# Patient Record
Sex: Male | Born: 1980 | Race: Black or African American | Hispanic: No | Marital: Married | Smoking: Never smoker
Health system: Southern US, Community
[De-identification: ages and names within clinical notes are randomized; demographics above are authoritative.]

## PROBLEM LIST (undated history)

## (undated) DIAGNOSIS — J302 Other seasonal allergic rhinitis: Secondary | ICD-10-CM

## (undated) DIAGNOSIS — Z9109 Other allergy status, other than to drugs and biological substances: Secondary | ICD-10-CM

## (undated) DIAGNOSIS — T7840XA Allergy, unspecified, initial encounter: Secondary | ICD-10-CM

## (undated) HISTORY — DX: Allergy, unspecified, initial encounter: T78.40XA

## (undated) HISTORY — PX: OTHER SURGICAL HISTORY: SHX169

## (undated) HISTORY — DX: Other allergy status, other than to drugs and biological substances: Z91.09

## (undated) HISTORY — DX: Other seasonal allergic rhinitis: J30.2

---

## 2005-03-23 ENCOUNTER — Emergency Department (HOSPITAL_COMMUNITY): Admission: EM | Admit: 2005-03-23 | Discharge: 2005-03-23 | Payer: Self-pay | Admitting: Family Medicine

## 2013-10-16 ENCOUNTER — Encounter: Payer: Self-pay | Admitting: Physician Assistant

## 2013-10-16 ENCOUNTER — Ambulatory Visit (INDEPENDENT_AMBULATORY_CARE_PROVIDER_SITE_OTHER): Payer: 59 | Admitting: Physician Assistant

## 2013-10-16 VITALS — BP 118/78 | HR 75 | Temp 97.9°F | Resp 16 | Ht 66.0 in | Wt 221.5 lb

## 2013-10-16 DIAGNOSIS — K219 Gastro-esophageal reflux disease without esophagitis: Secondary | ICD-10-CM

## 2013-10-16 DIAGNOSIS — Z23 Encounter for immunization: Secondary | ICD-10-CM

## 2013-10-16 DIAGNOSIS — Z0000 Encounter for general adult medical examination without abnormal findings: Secondary | ICD-10-CM

## 2013-10-16 LAB — BASIC METABOLIC PANEL
BUN: 8 mg/dL (ref 6–23)
CO2: 26 mEq/L (ref 19–32)
Calcium: 9.7 mg/dL (ref 8.4–10.5)
Chloride: 101 mEq/L (ref 96–112)
Creat: 0.82 mg/dL (ref 0.50–1.35)
Glucose, Bld: 81 mg/dL (ref 70–99)
POTASSIUM: 4.3 meq/L (ref 3.5–5.3)
Sodium: 137 mEq/L (ref 135–145)

## 2013-10-16 LAB — CBC
HEMATOCRIT: 43.7 % (ref 39.0–52.0)
Hemoglobin: 14.9 g/dL (ref 13.0–17.0)
MCH: 29.4 pg (ref 26.0–34.0)
MCHC: 34.1 g/dL (ref 30.0–36.0)
MCV: 86.2 fL (ref 78.0–100.0)
Platelets: 282 10*3/uL (ref 150–400)
RBC: 5.07 MIL/uL (ref 4.22–5.81)
RDW: 13.5 % (ref 11.5–15.5)
WBC: 5 10*3/uL (ref 4.0–10.5)

## 2013-10-16 LAB — LIPID PANEL
Cholesterol: 238 mg/dL — ABNORMAL HIGH (ref 0–200)
HDL: 36 mg/dL — ABNORMAL LOW (ref >39)
LDL Cholesterol: 179 mg/dL — ABNORMAL HIGH (ref 0–99)
TRIGLYCERIDES: 117 mg/dL (ref <150)
Total CHOL/HDL Ratio: 6.6 Ratio
VLDL: 23 mg/dL (ref 0–40)

## 2013-10-16 LAB — HEPATIC FUNCTION PANEL
ALT: 51 U/L (ref 0–53)
AST: 25 U/L (ref 0–37)
Albumin: 4.4 g/dL (ref 3.5–5.2)
Alkaline Phosphatase: 69 U/L (ref 39–117)
BILIRUBIN DIRECT: 0.1 mg/dL (ref 0.0–0.3)
BILIRUBIN INDIRECT: 0.3 mg/dL (ref 0.2–1.2)
Total Bilirubin: 0.4 mg/dL (ref 0.2–1.2)
Total Protein: 7.9 g/dL (ref 6.0–8.3)

## 2013-10-16 LAB — HEMOGLOBIN A1C
Hgb A1c MFr Bld: 5.2 % (ref <5.7)
Mean Plasma Glucose: 103 mg/dL (ref <117)

## 2013-10-16 LAB — TSH: TSH: 1.695 u[IU]/mL (ref 0.350–4.500)

## 2013-10-16 NOTE — Progress Notes (Signed)
Pre visit review using our clinic review tool, if applicable. No additional management support is needed unless otherwise documented below in the visit note/SLS  

## 2013-10-16 NOTE — Progress Notes (Signed)
Patient presents to clinic today to establish care.  Patient also requesting CPE.  Is fasting for labs.  Acute Concerns: Patient c/o reflux and queasiness after eating a heavy or greasy meal.  Happens a couple times per week.  Denies alcohol consumption or NSAID use.  Denies abdominal pain, nausea, tenesmus, melena or hematochezia. Denies constipation or loose stools.  Denies chronic cough.   Denies globus.   Patient also recently seen by his Optometrist for yearly check up.  Was told he has slightly elevated IOP and they recommended he see a PCP to assess for diabetes.  Denies hx of elevated glucose.  Denies vision changes, neuropathy, polyuria or urinary frequency/urgency.  Health Maintenance: Dental -- UTD. Vision -- UTD.    Immunizations -- Due for Tetanus.  Past Medical History  Diagnosis Date  . Environmental allergies   . Seasonal allergies     Past Surgical History  Procedure Laterality Date  . Unremarkable.      No current outpatient prescriptions on file prior to visit.   No current facility-administered medications on file prior to visit.    No Known Allergies  Family History  Problem Relation Age of Onset  . Hypertension Father     Living  . Cervical cancer Mother     Living  . Hypertension Paternal Grandmother   . Hypertension Paternal Grandfather   . Stroke Paternal Grandfather   . Healthy Brother     x3  . Healthy Sister     x4  . Healthy Daughter     x1  . Diabetes Maternal Grandmother     History   Social History  . Marital Status: Married    Spouse Name: N/A    Number of Children: N/A  . Years of Education: N/A   Occupational History  . Not on file.   Social History Main Topics  . Smoking status: Never Smoker   . Smokeless tobacco: Never Used  . Alcohol Use: No  . Drug Use: No  . Sexual Activity: Yes   Other Topics Concern  . Not on file   Social History Narrative  . No narrative on file   Review of Systems  Constitutional:  Negative for fever and weight loss.  HENT: Negative for ear discharge, ear pain, hearing loss and tinnitus.   Eyes: Negative for blurred vision, double vision, photophobia and pain.  Respiratory: Negative for cough and shortness of breath.   Cardiovascular: Negative for chest pain and palpitations.  Gastrointestinal: Positive for vomiting. Negative for heartburn, nausea, abdominal pain, diarrhea, constipation, blood in stool and melena.  Genitourinary: Negative for dysuria, urgency, frequency, hematuria and flank pain.       Nocturia x 0.  Neurological: Negative for dizziness, loss of consciousness and headaches.  Endo/Heme/Allergies: Positive for environmental allergies.  Psychiatric/Behavioral: Negative for depression, suicidal ideas, hallucinations and substance abuse. The patient is not nervous/anxious and does not have insomnia.    BP 118/78  Pulse 75  Temp(Src) 97.9 F (36.6 C) (Oral)  Resp 16  Ht 5\' 6"  (1.676 m)  Wt 221 lb 8 oz (100.472 kg)  BMI 35.77 kg/m2  SpO2 98%  Physical Exam  Vitals reviewed. Constitutional: He is oriented to person, place, and time and well-developed, well-nourished, and in no distress.  HENT:  Head: Normocephalic and atraumatic.  Right Ear: External ear normal.  Left Ear: External ear normal.  Nose: Nose normal.  Mouth/Throat: Oropharynx is clear and moist.  Eyes: Conjunctivae are normal. Pupils are equal, round,  and reactive to light.  Neck: Neck supple. No thyromegaly present.  Cardiovascular: Normal rate, regular rhythm, normal heart sounds and intact distal pulses.   Pulmonary/Chest: Effort normal and breath sounds normal. No respiratory distress. He has no wheezes. He has no rales. He exhibits no tenderness.  Abdominal: Soft. Bowel sounds are normal. He exhibits no distension and no mass. There is no tenderness. There is no rebound and no guarding.  Lymphadenopathy:    He has no cervical adenopathy.  Neurological: He is alert and oriented to  person, place, and time.  Skin: Skin is warm and dry. No rash noted.  Psychiatric: Affect normal.   Assessment/Plan: GERD (gastroesophageal reflux disease) Discussed dietary and lifestyle changes including weight loss.  Avoid trigger foods.  Avoid late-night meals.  Take a daily probiotic.  Can take a Pepcid for acute symptoms.  Follow-up if symptoms are not improving.  Need for prophylactic vaccination with combined diphtheria-tetanus-pertussis (DTP) vaccine Immunization given by nursing.  Visit for preventive health examination Health maintenance up-to-date now that Tetanus has been given.  Will obtain fasting labs.

## 2013-10-16 NOTE — Patient Instructions (Signed)
Please obtain labs.  I will call you with your results.  For regurgitation symptoms -- try to avoid heavy or greasy meals.  Start a daily probiotic (I recommend Align).  Stay well hydrated.  Avoid late-night eating.  Please let me know if symptoms are not improving.  Preventive Care for Adults A healthy lifestyle and preventive care can promote health and wellness. Preventive health guidelines for men include the following key practices:  A routine yearly physical is a good way to check with your health care provider about your health and preventative screening. It is a chance to share any concerns and updates on your health and to receive a thorough exam.  Visit your dentist for a routine exam and preventative care every 6 months. Brush your teeth twice a day and floss once a day. Good oral hygiene prevents tooth decay and gum disease.  The frequency of eye exams is based on your age, health, family medical history, use of contact lenses, and other factors. Follow your health care provider's recommendations for frequency of eye exams.  Eat a healthy diet. Foods such as vegetables, fruits, whole grains, low-fat dairy products, and lean protein foods contain the nutrients you need without too many calories. Decrease your intake of foods high in solid fats, added sugars, and salt. Eat the right amount of calories for you.Get information about a proper diet from your health care provider, if necessary.  Regular physical exercise is one of the most important things you can do for your health. Most adults should get at least 150 minutes of moderate-intensity exercise (any activity that increases your heart rate and causes you to sweat) each week. In addition, most adults need muscle-strengthening exercises on 2 or more days a week.  Maintain a healthy weight. The body mass index (BMI) is a screening tool to identify possible weight problems. It provides an estimate of body fat based on height and weight.  Your health care provider can find your BMI and can help you achieve or maintain a healthy weight.For adults 20 years and older:  A BMI below 18.5 is considered underweight.  A BMI of 18.5 to 24.9 is normal.  A BMI of 25 to 29.9 is considered overweight.  A BMI of 30 and above is considered obese.  Maintain normal blood lipids and cholesterol levels by exercising and minimizing your intake of saturated fat. Eat a balanced diet with plenty of fruit and vegetables. Blood tests for lipids and cholesterol should begin at age 22 and be repeated every 5 years. If your lipid or cholesterol levels are high, you are over 50, or you are at high risk for heart disease, you may need your cholesterol levels checked more frequently.Ongoing high lipid and cholesterol levels should be treated with medicines if diet and exercise are not working.  If you smoke, find out from your health care provider how to quit. If you do not use tobacco, do not start.  Lung cancer screening is recommended for adults aged 76-80 years who are at high risk for developing lung cancer because of a history of smoking. A yearly low-dose CT scan of the lungs is recommended for people who have at least a 30-pack-year history of smoking and are a current smoker or have quit within the past 15 years. A pack year of smoking is smoking an average of 1 pack of cigarettes a day for 1 year (for example: 1 pack a day for 30 years or 2 packs a day for  15 years). Yearly screening should continue until the smoker has stopped smoking for at least 15 years. Yearly screening should be stopped for people who develop a health problem that would prevent them from having lung cancer treatment.  If you choose to drink alcohol, do not have more than 2 drinks per day. One drink is considered to be 12 ounces (355 mL) of beer, 5 ounces (148 mL) of wine, or 1.5 ounces (44 mL) of liquor.  Avoid use of street drugs. Do not share needles with anyone. Ask for help  if you need support or instructions about stopping the use of drugs.  High blood pressure causes heart disease and increases the risk of stroke. Your blood pressure should be checked at least every 1-2 years. Ongoing high blood pressure should be treated with medicines, if weight loss and exercise are not effective.  If you are 88-27 years old, ask your health care provider if you should take aspirin to prevent heart disease.  Diabetes screening involves taking a blood sample to check your fasting blood sugar level. This should be done once every 3 years, after age 2, if you are within normal weight and without risk factors for diabetes. Testing should be considered at a younger age or be carried out more frequently if you are overweight and have at least 1 risk factor for diabetes.  Colorectal cancer can be detected and often prevented. Most routine colorectal cancer screening begins at the age of 71 and continues through age 56. However, your health care provider may recommend screening at an earlier age if you have risk factors for colon cancer. On a yearly basis, your health care provider may provide home test kits to check for hidden blood in the stool. Use of a small camera at the end of a tube to directly examine the colon (sigmoidoscopy or colonoscopy) can detect the earliest forms of colorectal cancer. Talk to your health care provider about this at age 64, when routine screening begins. Direct exam of the colon should be repeated every 5-10 years through age 71, unless early forms of precancerous polyps or small growths are found.  People who are at an increased risk for hepatitis B should be screened for this virus. You are considered at high risk for hepatitis B if:  You were born in a country where hepatitis B occurs often. Talk with your health care provider about which countries are considered high risk.  Your parents were born in a high-risk country and you have not received a shot to  protect against hepatitis B (hepatitis B vaccine).  You have HIV or AIDS.  You use needles to inject street drugs.  You live with, or have sex with, someone who has hepatitis B.  You are a man who has sex with other men (MSM).  You get hemodialysis treatment.  You take certain medicines for conditions such as cancer, organ transplantation, and autoimmune conditions.  Hepatitis C blood testing is recommended for all people born from [birth year] through 1965 and any individual with known risks for hepatitis C.  Practice safe sex. Use condoms and avoid high-risk sexual practices to reduce the spread of sexually transmitted infections (STIs). STIs include gonorrhea, chlamydia, syphilis, trichomonas, herpes, HPV, and human immunodeficiency virus (HIV). Herpes, HIV, and HPV are viral illnesses that have no cure. They can result in disability, cancer, and death.  If you are at risk of being infected with HIV, it is recommended that you take a prescription medicine daily  to prevent HIV infection. This is called preexposure prophylaxis (PrEP). You are considered at risk if:  You are a man who has sex with other men (MSM) and have other risk factors.  You are a heterosexual man, are sexually active, and are at increased risk for HIV infection.  You take drugs by injection.  You are sexually active with a partner who has HIV.  Talk with your health care provider about whether you are at high risk of being infected with HIV. If you choose to begin PrEP, you should first be tested for HIV. You should then be tested every 3 months for as long as you are taking PrEP.  A one-time screening for abdominal aortic aneurysm (AAA) and surgical repair of large AAAs by ultrasound are recommended for men ages 56 to 72 years who are current or former smokers.  Healthy men should no longer receive prostate-specific antigen (PSA) blood tests as part of routine cancer screening. Talk with your health care provider about  prostate cancer screening.  Testicular cancer screening is not recommended for adult males who have no symptoms. Screening includes self-exam, a health care provider exam, and other screening tests. Consult with your health care provider about any symptoms you have or any concerns you have about testicular cancer.  Use sunscreen. Apply sunscreen liberally and repeatedly throughout the day. You should seek shade when your shadow is shorter than you. Protect yourself by wearing long sleeves, pants, a wide-brimmed hat, and sunglasses year round, whenever you are outdoors.  Once a month, do a whole-body skin exam, using a mirror to look at the skin on your back. Tell your health care provider about new moles, moles that have irregular borders, moles that are larger than a pencil eraser, or moles that have changed in shape or color.  Stay current with required vaccines (immunizations).  Influenza vaccine. All adults should be immunized every year.  Tetanus, diphtheria, and acellular pertussis (Td, Tdap) vaccine. An adult who has not previously received Tdap or who does not know his vaccine status should receive 1 dose of Tdap. This initial dose should be followed by tetanus and diphtheria toxoids (Td) booster doses every 10 years. Adults with an unknown or incomplete history of completing a 3-dose immunization series with Td-containing vaccines should begin or complete a primary immunization series including a Tdap dose. Adults should receive a Td booster every 10 years.  Varicella vaccine. An adult without evidence of immunity to varicella should receive 2 doses or a second dose if he has previously received 1 dose.  Human papillomavirus (HPV) vaccine. Males aged 63-21 years who have not received the vaccine previously should receive the 3-dose series. Males aged 22-26 years may be immunized. Immunization is recommended through the age of 52 years for any male who has sex with males and did not get any  or all doses earlier. Immunization is recommended for any person with an immunocompromised condition through the age of 83 years if he did not get any or all doses earlier. During the 3-dose series, the second dose should be obtained 4-8 weeks after the first dose. The third dose should be obtained 24 weeks after the first dose and 16 weeks after the second dose.  Zoster vaccine. One dose is recommended for adults aged 64 years or older unless certain conditions are present.  Measles, mumps, and rubella (MMR) vaccine. Adults born before [birth year] generally are considered immune to measles and mumps. Adults born in [birth year] or later should  have 1 or more doses of MMR vaccine unless there is a contraindication to the vaccine or there is laboratory evidence of immunity to each of the three diseases. A routine second dose of MMR vaccine should be obtained at least 28 days after the first dose for students attending postsecondary schools, health care workers, or international travelers. People who received inactivated measles vaccine or an unknown type of measles vaccine during 1963-1967 should receive 2 doses of MMR vaccine. People who received inactivated mumps vaccine or an unknown type of mumps vaccine before 1979 and are at high risk for mumps infection should consider immunization with 2 doses of MMR vaccine. Unvaccinated health care workers born before [birth year] who lack laboratory evidence of measles, mumps, or rubella immunity or laboratory confirmation of disease should consider measles and mumps immunization with 2 doses of MMR vaccine or rubella immunization with 1 dose of MMR vaccine.  Pneumococcal 13-valent conjugate (PCV13) vaccine. When indicated, a person who is uncertain of his immunization history and has no record of immunization should receive the PCV13 vaccine. An adult aged 3 years or older who has certain medical conditions and has not been previously immunized should receive 1 dose of PCV13 vaccine.  This PCV13 should be followed with a dose of pneumococcal polysaccharide (PPSV23) vaccine. The PPSV23 vaccine dose should be obtained at least 8 weeks after the dose of PCV13 vaccine. An adult aged 20 years or older who has certain medical conditions and previously received 1 or more doses of PPSV23 vaccine should receive 1 dose of PCV13. The PCV13 vaccine dose should be obtained 1 or more years after the last PPSV23 vaccine dose.  Pneumococcal polysaccharide (PPSV23) vaccine. When PCV13 is also indicated, PCV13 should be obtained first. All adults aged 45 years and older should be immunized. An adult younger than age 64 years who has certain medical conditions should be immunized. Any person who resides in a nursing home or long-term care facility should be immunized. An adult smoker should be immunized. People with an immunocompromised condition and certain other conditions should receive both PCV13 and PPSV23 vaccines. People with human immunodeficiency virus (HIV) infection should be immunized as soon as possible after diagnosis. Immunization during chemotherapy or radiation therapy should be avoided. Routine use of PPSV23 vaccine is not recommended for American Indians, [place] Natives, or people younger than 65 years unless there are medical conditions that require PPSV23 vaccine. When indicated, people who have unknown immunization and have no record of immunization should receive PPSV23 vaccine. One-time revaccination 5 years after the first dose of PPSV23 is recommended for people aged 19-64 years who have chronic kidney failure, nephrotic syndrome, asplenia, or immunocompromised conditions. People who received 1-2 doses of PPSV23 before age 72 years should receive another dose of PPSV23 vaccine at age 89 years or later if at least 5 years have passed since the previous dose. Doses of PPSV23 are not needed for people immunized with PPSV23 at or after age 39 years.  Meningococcal vaccine. Adults with  asplenia or persistent complement component deficiencies should receive 2 doses of quadrivalent meningococcal conjugate (MenACWY-D) vaccine. The doses should be obtained at least 2 months apart. Microbiologists working with certain meningococcal bacteria, [employer] recruits, people at risk during an outbreak, and people who travel to or live in countries with a high rate of meningitis should be immunized. A first-year college student up through age 2 years who is living in a residence hall should receive a dose if he did not receive  a dose on or after his 16th birthday. Adults who have certain high-risk conditions should receive one or more doses of vaccine.  Hepatitis A vaccine. Adults who wish to be protected from this disease, have certain high-risk conditions, work with hepatitis A-infected animals, work in hepatitis A research labs, or travel to or work in countries with a high rate of hepatitis A should be immunized. Adults who were previously unvaccinated and who anticipate close contact with an international adoptee during the first 60 days after arrival in the [place] from a country with a high rate of hepatitis A should be immunized.  Hepatitis B vaccine. Adults should be immunized if they wish to be protected from this disease, have certain high-risk conditions, may be exposed to blood or other infectious body fluids, are household contacts or sex partners of hepatitis B positive people, are clients or workers in certain care facilities, or travel to or work in countries with a high rate of hepatitis B.  Haemophilus influenzae type b (Hib) vaccine. A previously unvaccinated person with asplenia or sickle cell disease or having a scheduled splenectomy should receive 1 dose of Hib vaccine. Regardless of previous immunization, a recipient of a hematopoietic stem cell transplant should receive a 3-dose series 6-12 months after his successful transplant. Hib vaccine is not recommended for adults  with HIV infection. Preventive Service / Frequency Ages 14 to 14  Blood pressure check.** / Every 1 to 2 years.  Lipid and cholesterol check.** / Every 5 years beginning at age 84.  Hepatitis C blood test.** / For any individual with known risks for hepatitis C.  Skin self-exam. / Monthly.  Influenza vaccine. / Every year.  Tetanus, diphtheria, and acellular pertussis (Tdap, Td) vaccine.** / Consult your health care provider. 1 dose of Td every 10 years.  Varicella vaccine.** / Consult your health care provider.  HPV vaccine. / 3 doses over 6 months, if 63 or younger.  Measles, mumps, rubella (MMR) vaccine.** / You need at least 1 dose of MMR if you were born in 1957 or later. You may also need a second dose.  Pneumococcal 13-valent conjugate (PCV13) vaccine.** / Consult your health care provider.  Pneumococcal polysaccharide (PPSV23) vaccine.** / 1 to 2 doses if you smoke cigarettes or if you have certain conditions.  Meningococcal vaccine.** / 1 dose if you are age 62 to 93 years and a Market researcher living in a residence hall, or have one of several medical conditions. You may also need additional booster doses.  Hepatitis A vaccine.** / Consult your health care provider.  Hepatitis B vaccine.** / Consult your health care provider.  Haemophilus influenzae type b (Hib) vaccine.** / Consult your health care provider. Ages 39 to 57  Blood pressure check.** / Every 1 to 2 years.  Lipid and cholesterol check.** / Every 5 years beginning at age 71.  Lung cancer screening. / Every year if you are aged 27-80 years and have a 30-pack-year history of smoking and currently smoke or have quit within the past 15 years. Yearly screening is stopped once you have quit smoking for at least 15 years or develop a health problem that would prevent you from having lung cancer treatment.  Fecal occult blood test (FOBT) of stool. / Every year beginning at age 20 and continuing until  age 70. You may not have to do this test if you get a colonoscopy every 10 years.  Flexible sigmoidoscopy** or colonoscopy.** / Every 5 years for a  flexible sigmoidoscopy or every 10 years for a colonoscopy beginning at age 54 and continuing until age 22.  Hepatitis C blood test.** / For all people born from [birth year] through 1965 and any individual with known risks for hepatitis C.  Skin self-exam. / Monthly.  Influenza vaccine. / Every year.  Tetanus, diphtheria, and acellular pertussis (Tdap/Td) vaccine.** / Consult your health care provider. 1 dose of Td every 10 years.  Varicella vaccine.** / Consult your health care provider.  Zoster vaccine.** / 1 dose for adults aged 72 years or older.  Measles, mumps, rubella (MMR) vaccine.** / You need at least 1 dose of MMR if you were born in 1956 or later. You may also need a second dose.  Pneumococcal 13-valent conjugate (PCV13) vaccine.** / Consult your health care provider.  Pneumococcal polysaccharide (PPSV23) vaccine.** / 1 to 2 doses if you smoke cigarettes or if you have certain conditions.  Meningococcal vaccine.** / Consult your health care provider.  Hepatitis A vaccine.** / Consult your health care provider.  Hepatitis B vaccine.** / Consult your health care provider.  Haemophilus influenzae type b (Hib) vaccine.** / Consult your health care provider. Ages 65 and over  Blood pressure check.** / Every 1 to 2 years.  Lipid and cholesterol check.**/ Every 5 years beginning at age 52.  Lung cancer screening. / Every year if you are aged 40-80 years and have a 30-pack-year history of smoking and currently smoke or have quit within the past 15 years. Yearly screening is stopped once you have quit smoking for at least 15 years or develop a health problem that would prevent you from having lung cancer treatment.  Fecal occult blood test (FOBT) of stool. / Every year beginning at age 61 and continuing until age 42. You may not have to  do this test if you get a colonoscopy every 10 years.  Flexible sigmoidoscopy** or colonoscopy.** / Every 5 years for a flexible sigmoidoscopy or every 10 years for a colonoscopy beginning at age 73 and continuing until age 42.  Hepatitis C blood test.** / For all people born from [birth year] through 1964 and any individual with known risks for hepatitis C.  Abdominal aortic aneurysm (AAA) screening.** / A one-time screening for ages 64 to 15 years who are current or former smokers.  Skin self-exam. / Monthly.  Influenza vaccine. / Every year.  Tetanus, diphtheria, and acellular pertussis (Tdap/Td) vaccine.** / 1 dose of Td every 10 years.  Varicella vaccine.** / Consult your health care provider.  Zoster vaccine.** / 1 dose for adults aged 84 years or older.  Pneumococcal 13-valent conjugate (PCV13) vaccine.** / Consult your health care provider.  Pneumococcal polysaccharide (PPSV23) vaccine.** / 1 dose for all adults aged 14 years and older.  Meningococcal vaccine.** / Consult your health care provider.  Hepatitis A vaccine.** / Consult your health care provider.  Hepatitis B vaccine.** / Consult your health care provider.  Haemophilus influenzae type b (Hib) vaccine.** / Consult your health care provider. **Family history and personal history of risk and conditions may change your health care provider's recommendations. Document Released: 05/22/2001 Document Revised: 03/31/2013 Document Reviewed: 08/21/2010 [facility] Patient Information 2015 [place], [place]. This information is not intended to replace advice given to you by your health care provider. Make sure you discuss any questions you have with your health care provider.

## 2013-10-17 LAB — URINALYSIS, ROUTINE W REFLEX MICROSCOPIC
BILIRUBIN URINE: NEGATIVE
GLUCOSE, UA: NEGATIVE mg/dL
Hgb urine dipstick: NEGATIVE
Ketones, ur: NEGATIVE mg/dL
Leukocytes, UA: NEGATIVE
Nitrite: NEGATIVE
PH: 6.5 (ref 5.0–8.0)
Protein, ur: NEGATIVE mg/dL
SPECIFIC GRAVITY, URINE: 1.022 (ref 1.005–1.030)
Urobilinogen, UA: 0.2 mg/dL (ref 0.0–1.0)

## 2013-10-20 DIAGNOSIS — Z23 Encounter for immunization: Secondary | ICD-10-CM | POA: Insufficient documentation

## 2013-10-20 DIAGNOSIS — K219 Gastro-esophageal reflux disease without esophagitis: Secondary | ICD-10-CM | POA: Insufficient documentation

## 2013-10-20 DIAGNOSIS — Z0000 Encounter for general adult medical examination without abnormal findings: Secondary | ICD-10-CM | POA: Insufficient documentation

## 2013-10-20 NOTE — Assessment & Plan Note (Signed)
Health maintenance up-to-date now that Tetanus has been given.  Will obtain fasting labs.

## 2013-10-20 NOTE — Assessment & Plan Note (Signed)
Immunization given by nursing.

## 2013-10-20 NOTE — Assessment & Plan Note (Signed)
Discussed dietary and lifestyle changes including weight loss.  Avoid trigger foods.  Avoid late-night meals.  Take a daily probiotic.  Can take a Pepcid for acute symptoms.  Follow-up if symptoms are not improving.

## 2013-10-28 ENCOUNTER — Encounter: Payer: 59 | Admitting: Physician Assistant

## 2013-10-28 DIAGNOSIS — Z0289 Encounter for other administrative examinations: Secondary | ICD-10-CM

## 2013-10-29 ENCOUNTER — Telehealth: Payer: Self-pay | Admitting: Physician Assistant

## 2013-10-29 NOTE — Telephone Encounter (Signed)
Thank you for making me aware.  Can we call patient to remind him of missed appointment and for him to reschedule CPE.

## 2013-10-29 NOTE — Telephone Encounter (Signed)
See note below

## 2013-10-29 NOTE — Telephone Encounter (Signed)
Patient scheduled for cpe yesterday at 430  Did not come

## 2014-07-13 ENCOUNTER — Telehealth: Payer: Self-pay | Admitting: *Deleted

## 2014-07-14 ENCOUNTER — Encounter: Payer: Self-pay | Admitting: Physician Assistant

## 2014-07-14 ENCOUNTER — Ambulatory Visit (INDEPENDENT_AMBULATORY_CARE_PROVIDER_SITE_OTHER): Payer: 59 | Admitting: Physician Assistant

## 2014-07-14 VITALS — BP 134/82 | HR 82 | Temp 97.9°F | Resp 16 | Ht 66.0 in | Wt 231.2 lb

## 2014-07-14 DIAGNOSIS — Z0000 Encounter for general adult medical examination without abnormal findings: Secondary | ICD-10-CM

## 2014-07-14 DIAGNOSIS — B356 Tinea cruris: Secondary | ICD-10-CM

## 2014-07-14 DIAGNOSIS — J309 Allergic rhinitis, unspecified: Secondary | ICD-10-CM

## 2014-07-14 LAB — HEPATIC FUNCTION PANEL
ALBUMIN: 4.1 g/dL (ref 3.5–5.2)
ALK PHOS: 65 U/L (ref 39–117)
ALT: 40 U/L (ref 0–53)
AST: 24 U/L (ref 0–37)
BILIRUBIN DIRECT: 0 mg/dL (ref 0.0–0.3)
Total Bilirubin: 0.3 mg/dL (ref 0.2–1.2)
Total Protein: 8.1 g/dL (ref 6.0–8.3)

## 2014-07-14 LAB — LIPID PANEL
CHOLESTEROL: 245 mg/dL — AB (ref 0–200)
HDL: 35.1 mg/dL — AB (ref >39.00)
LDL Cholesterol: 184 mg/dL — ABNORMAL HIGH (ref 0–99)
NonHDL: 209.9
TRIGLYCERIDES: 130 mg/dL (ref 0.0–149.0)
Total CHOL/HDL Ratio: 7
VLDL: 26 mg/dL (ref 0.0–40.0)

## 2014-07-14 LAB — BASIC METABOLIC PANEL
BUN: 11 mg/dL (ref 6–23)
CO2: 25 meq/L (ref 19–32)
CREATININE: 0.79 mg/dL (ref 0.40–1.50)
Calcium: 9.5 mg/dL (ref 8.4–10.5)
Chloride: 104 mEq/L (ref 96–112)
GFR: 145.29 mL/min (ref >60.00)
GLUCOSE: 88 mg/dL (ref 70–99)
POTASSIUM: 3.9 meq/L (ref 3.5–5.1)
Sodium: 136 mEq/L (ref 135–145)

## 2014-07-14 LAB — URINALYSIS, ROUTINE W REFLEX MICROSCOPIC
Bilirubin Urine: NEGATIVE
Hgb urine dipstick: NEGATIVE
Ketones, ur: NEGATIVE
Leukocytes, UA: NEGATIVE
Nitrite: NEGATIVE
RBC / HPF: NONE SEEN (ref 0–?)
Specific Gravity, Urine: >=1.03 — AB (ref 1.000–1.030)
TOTAL PROTEIN, URINE-UPE24: NEGATIVE
UROBILINOGEN UA: 0.2 (ref 0.0–1.0)
Urine Glucose: NEGATIVE
WBC, UA: NONE SEEN (ref 0–?)
pH: 5.5 (ref 5.0–8.0)

## 2014-07-14 LAB — CBC
HCT: 46.4 % (ref 39.0–52.0)
Hemoglobin: 15.4 g/dL (ref 13.0–17.0)
MCHC: 33.2 g/dL (ref 30.0–36.0)
MCV: 87.5 fl (ref 78.0–100.0)
Platelets: 258 10*3/uL (ref 150.0–400.0)
RBC: 5.3 Mil/uL (ref 4.22–5.81)
RDW: 14.1 % (ref 11.5–15.5)
WBC: 5 10*3/uL (ref 4.0–10.5)

## 2014-07-14 LAB — TSH: TSH: 1.94 u[IU]/mL (ref 0.35–4.50)

## 2014-07-14 LAB — HEMOGLOBIN A1C: Hgb A1c MFr Bld: 5.2 % (ref 4.6–6.5)

## 2014-07-14 MED ORDER — FLUTICASONE PROPIONATE 50 MCG/ACT NA SUSP: 1.0000 | Freq: Every day | NASAL | Status: DC

## 2014-07-14 MED ORDER — CLOTRIMAZOLE-BETAMETHASONE 1-0.05 % EX CREA: 1.0000 "application " | TOPICAL_CREAM | Freq: Two times a day (BID) | CUTANEOUS | Status: DC

## 2014-07-14 NOTE — Assessment & Plan Note (Signed)
I have reviewed the patient's medical history in detail and updated the computerized patient record.  Immunizations are up-to-date. Preventive care discussed today.  Handout given.  Will obtain fasting labs today to include one-time HIV screening per USPTF Grade A recommendations.

## 2014-07-14 NOTE — Progress Notes (Signed)
Pre visit review using our clinic review tool, if applicable. No additional management support is needed unless otherwise documented below in the visit note/SLS  

## 2014-07-14 NOTE — Patient Instructions (Signed)
Please continue the Flonase daily for allergy symptoms.  Stop by the lab for blood work. I will call you with your results.  Please apply the Clotrimazole ointment to the area of concern twice daily for 2 weeks. Keep the area clean and dry.  Preventive Care for Adults A healthy lifestyle and preventive care can promote health and wellness. Preventive health guidelines for men include the following key practices:  A routine yearly physical is a good way to check with your health care provider about your health and preventative screening. It is a chance to share any concerns and updates on your health and to receive a thorough exam.  Visit your dentist for a routine exam and preventative care every 6 months. Brush your teeth twice a day and floss once a day. Good oral hygiene prevents tooth decay and gum disease.  The frequency of eye exams is based on your age, health, family medical history, use of contact lenses, and other factors. Follow your health care provider's recommendations for frequency of eye exams.  Eat a healthy diet. Foods such as vegetables, fruits, whole grains, low-fat dairy products, and lean protein foods contain the nutrients you need without too many calories. Decrease your intake of foods high in solid fats, added sugars, and salt. Eat the right amount of calories for you.Get information about a proper diet from your health care provider, if necessary.  Regular physical exercise is one of the most important things you can do for your health. Most adults should get at least 150 minutes of moderate-intensity exercise (any activity that increases your heart rate and causes you to sweat) each week. In addition, most adults need muscle-strengthening exercises on 2 or more days a week.  Maintain a healthy weight. The body mass index (BMI) is a screening tool to identify possible weight problems. It provides an estimate of body fat based on height and weight. Your health care  provider can find your BMI and can help you achieve or maintain a healthy weight.For adults 20 years and older:  A BMI below 18.5 is considered underweight.  A BMI of 18.5 to 24.9 is normal.  A BMI of 25 to 29.9 is considered overweight.  A BMI of 30 and above is considered obese.  Maintain normal blood lipids and cholesterol levels by exercising and minimizing your intake of saturated fat. Eat a balanced diet with plenty of fruit and vegetables. Blood tests for lipids and cholesterol should begin at age 56 and be repeated every 5 years. If your lipid or cholesterol levels are high, you are over 50, or you are at high risk for heart disease, you may need your cholesterol levels checked more frequently.Ongoing high lipid and cholesterol levels should be treated with medicines if diet and exercise are not working.  If you smoke, find out from your health care provider how to quit. If you do not use tobacco, do not start.  Lung cancer screening is recommended for adults aged 93-80 years who are at high risk for developing lung cancer because of a history of smoking. A yearly low-dose CT scan of the lungs is recommended for people who have at least a 30-pack-year history of smoking and are a current smoker or have quit within the past 15 years. A pack year of smoking is smoking an average of 1 pack of cigarettes a day for 1 year (for example: 1 pack a day for 30 years or 2 packs a day for 15 years). Yearly screening  should continue until the smoker has stopped smoking for at least 15 years. Yearly screening should be stopped for people who develop a health problem that would prevent them from having lung cancer treatment.  If you choose to drink alcohol, do not have more than 2 drinks per day. One drink is considered to be 12 ounces (355 mL) of beer, 5 ounces (148 mL) of wine, or 1.5 ounces (44 mL) of liquor.  Avoid use of street drugs. Do not share needles with anyone. Ask for help if you need  support or instructions about stopping the use of drugs.  High blood pressure causes heart disease and increases the risk of stroke. Your blood pressure should be checked at least every 1-2 years. Ongoing high blood pressure should be treated with medicines, if weight loss and exercise are not effective.  If you are 41-34 years old, ask your health care provider if you should take aspirin to prevent heart disease.  Diabetes screening involves taking a blood sample to check your fasting blood sugar level. This should be done once every 3 years, after age 78, if you are within normal weight and without risk factors for diabetes. Testing should be considered at a younger age or be carried out more frequently if you are overweight and have at least 1 risk factor for diabetes.  Colorectal cancer can be detected and often prevented. Most routine colorectal cancer screening begins at the age of 36 and continues through age 30. However, your health care provider may recommend screening at an earlier age if you have risk factors for colon cancer. On a yearly basis, your health care provider may provide home test kits to check for hidden blood in the stool. Use of a small camera at the end of a tube to directly examine the colon (sigmoidoscopy or colonoscopy) can detect the earliest forms of colorectal cancer. Talk to your health care provider about this at age 40, when routine screening begins. Direct exam of the colon should be repeated every 5-10 years through age 73, unless early forms of precancerous polyps or small growths are found.  People who are at an increased risk for hepatitis B should be screened for this virus. You are considered at high risk for hepatitis B if:  You were born in a country where hepatitis B occurs often. Talk with your health care provider about which countries are considered high risk.  Your parents were born in a high-risk country and you have not received a shot to protect  against hepatitis B (hepatitis B vaccine).  You have HIV or AIDS.  You use needles to inject street drugs.  You live with, or have sex with, someone who has hepatitis B.  You are a man who has sex with other men (MSM).  You get hemodialysis treatment.  You take certain medicines for conditions such as cancer, organ transplantation, and autoimmune conditions.  Hepatitis C blood testing is recommended for all people born from [birth year] through 1965 and any individual with known risks for hepatitis C.  Practice safe sex. Use condoms and avoid high-risk sexual practices to reduce the spread of sexually transmitted infections (STIs). STIs include gonorrhea, chlamydia, syphilis, trichomonas, herpes, HPV, and human immunodeficiency virus (HIV). Herpes, HIV, and HPV are viral illnesses that have no cure. They can result in disability, cancer, and death.  If you are at risk of being infected with HIV, it is recommended that you take a prescription medicine daily to prevent HIV infection.  This is called preexposure prophylaxis (PrEP). You are considered at risk if:  You are a man who has sex with other men (MSM) and have other risk factors.  You are a heterosexual man, are sexually active, and are at increased risk for HIV infection.  You take drugs by injection.  You are sexually active with a partner who has HIV.  Talk with your health care provider about whether you are at high risk of being infected with HIV. If you choose to begin PrEP, you should first be tested for HIV. You should then be tested every 3 months for as long as you are taking PrEP.  A one-time screening for abdominal aortic aneurysm (AAA) and surgical repair of large AAAs by ultrasound are recommended for men ages 59 to 35 years who are current or former smokers.  Healthy men should no longer receive prostate-specific antigen (PSA) blood tests as part of routine cancer screening. Talk with your health care provider about  prostate cancer screening.  Testicular cancer screening is not recommended for adult males who have no symptoms. Screening includes self-exam, a health care provider exam, and other screening tests. Consult with your health care provider about any symptoms you have or any concerns you have about testicular cancer.  Use sunscreen. Apply sunscreen liberally and repeatedly throughout the day. You should seek shade when your shadow is shorter than you. Protect yourself by wearing long sleeves, pants, a wide-brimmed hat, and sunglasses year round, whenever you are outdoors.  Once a month, do a whole-body skin exam, using a mirror to look at the skin on your back. Tell your health care provider about new moles, moles that have irregular borders, moles that are larger than a pencil eraser, or moles that have changed in shape or color.  Stay current with required vaccines (immunizations).  Influenza vaccine. All adults should be immunized every year.  Tetanus, diphtheria, and acellular pertussis (Td, Tdap) vaccine. An adult who has not previously received Tdap or who does not know his vaccine status should receive 1 dose of Tdap. This initial dose should be followed by tetanus and diphtheria toxoids (Td) booster doses every 10 years. Adults with an unknown or incomplete history of completing a 3-dose immunization series with Td-containing vaccines should begin or complete a primary immunization series including a Tdap dose. Adults should receive a Td booster every 10 years.  Varicella vaccine. An adult without evidence of immunity to varicella should receive 2 doses or a second dose if he has previously received 1 dose.  Human papillomavirus (HPV) vaccine. Males aged 6-21 years who have not received the vaccine previously should receive the 3-dose series. Males aged 22-26 years may be immunized. Immunization is recommended through the age of 91 years for any male who has sex with males and did not get any  or all doses earlier. Immunization is recommended for any person with an immunocompromised condition through the age of 74 years if he did not get any or all doses earlier. During the 3-dose series, the second dose should be obtained 4-8 weeks after the first dose. The third dose should be obtained 24 weeks after the first dose and 16 weeks after the second dose.  Zoster vaccine. One dose is recommended for adults aged 87 years or older unless certain conditions are present.  Measles, mumps, and rubella (MMR) vaccine. Adults born before [birth year] generally are considered immune to measles and mumps. Adults born in [birth year] or later should have 1 or more  doses of MMR vaccine unless there is a contraindication to the vaccine or there is laboratory evidence of immunity to each of the three diseases. A routine second dose of MMR vaccine should be obtained at least 28 days after the first dose for students attending postsecondary schools, health care workers, or international travelers. People who received inactivated measles vaccine or an unknown type of measles vaccine during 1963-1967 should receive 2 doses of MMR vaccine. People who received inactivated mumps vaccine or an unknown type of mumps vaccine before 1979 and are at high risk for mumps infection should consider immunization with 2 doses of MMR vaccine. Unvaccinated health care workers born before [birth year] who lack laboratory evidence of measles, mumps, or rubella immunity or laboratory confirmation of disease should consider measles and mumps immunization with 2 doses of MMR vaccine or rubella immunization with 1 dose of MMR vaccine.  Pneumococcal 13-valent conjugate (PCV13) vaccine. When indicated, a person who is uncertain of his immunization history and has no record of immunization should receive the PCV13 vaccine. An adult aged 66 years or older who has certain medical conditions and has not been previously immunized should receive 1 dose of PCV13 vaccine.  This PCV13 should be followed with a dose of pneumococcal polysaccharide (PPSV23) vaccine. The PPSV23 vaccine dose should be obtained at least 8 weeks after the dose of PCV13 vaccine. An adult aged 39 years or older who has certain medical conditions and previously received 1 or more doses of PPSV23 vaccine should receive 1 dose of PCV13. The PCV13 vaccine dose should be obtained 1 or more years after the last PPSV23 vaccine dose.  Pneumococcal polysaccharide (PPSV23) vaccine. When PCV13 is also indicated, PCV13 should be obtained first. All adults aged 47 years and older should be immunized. An adult younger than age 7 years who has certain medical conditions should be immunized. Any person who resides in a nursing home or long-term care facility should be immunized. An adult smoker should be immunized. People with an immunocompromised condition and certain other conditions should receive both PCV13 and PPSV23 vaccines. People with human immunodeficiency virus (HIV) infection should be immunized as soon as possible after diagnosis. Immunization during chemotherapy or radiation therapy should be avoided. Routine use of PPSV23 vaccine is not recommended for American Indians, [place] Natives, or people younger than 65 years unless there are medical conditions that require PPSV23 vaccine. When indicated, people who have unknown immunization and have no record of immunization should receive PPSV23 vaccine. One-time revaccination 5 years after the first dose of PPSV23 is recommended for people aged 19-64 years who have chronic kidney failure, nephrotic syndrome, asplenia, or immunocompromised conditions. People who received 1-2 doses of PPSV23 before age 13 years should receive another dose of PPSV23 vaccine at age 73 years or later if at least 5 years have passed since the previous dose. Doses of PPSV23 are not needed for people immunized with PPSV23 at or after age 37 years.  Meningococcal vaccine. Adults with  asplenia or persistent complement component deficiencies should receive 2 doses of quadrivalent meningococcal conjugate (MenACWY-D) vaccine. The doses should be obtained at least 2 months apart. Microbiologists working with certain meningococcal bacteria, [place] recruits, people at risk during an outbreak, and people who travel to or live in countries with a high rate of meningitis should be immunized. A first-year college student up through age 91 years who is living in a residence hall should receive a dose if he did not receive a dose on or  after his 16th birthday. Adults who have certain high-risk conditions should receive one or more doses of vaccine.  Hepatitis A vaccine. Adults who wish to be protected from this disease, have certain high-risk conditions, work with hepatitis A-infected animals, work in hepatitis A research labs, or travel to or work in countries with a high rate of hepatitis A should be immunized. Adults who were previously unvaccinated and who anticipate close contact with an international adoptee during the first 60 days after arrival in the [place] from a country with a high rate of hepatitis A should be immunized.  Hepatitis B vaccine. Adults should be immunized if they wish to be protected from this disease, have certain high-risk conditions, may be exposed to blood or other infectious body fluids, are household contacts or sex partners of hepatitis B positive people, are clients or workers in certain care facilities, or travel to or work in countries with a high rate of hepatitis B.  Haemophilus influenzae type b (Hib) vaccine. A previously unvaccinated person with asplenia or sickle cell disease or having a scheduled splenectomy should receive 1 dose of Hib vaccine. Regardless of previous immunization, a recipient of a hematopoietic stem cell transplant should receive a 3-dose series 6-12 months after his successful transplant. Hib vaccine is not recommended for adults  with HIV infection. Preventive Service / Frequency Ages 36 to 71  Blood pressure check.** / Every 1 to 2 years.  Lipid and cholesterol check.** / Every 5 years beginning at age 43.  Hepatitis C blood test.** / For any individual with known risks for hepatitis C.  Skin self-exam. / Monthly.  Influenza vaccine. / Every year.  Tetanus, diphtheria, and acellular pertussis (Tdap, Td) vaccine.** / Consult your health care provider. 1 dose of Td every 10 years.  Varicella vaccine.** / Consult your health care provider.  HPV vaccine. / 3 doses over 6 months, if 39 or younger.  Measles, mumps, rubella (MMR) vaccine.** / You need at least 1 dose of MMR if you were born in 1957 or later. You may also need a second dose.  Pneumococcal 13-valent conjugate (PCV13) vaccine.** / Consult your health care provider.  Pneumococcal polysaccharide (PPSV23) vaccine.** / 1 to 2 doses if you smoke cigarettes or if you have certain conditions.  Meningococcal vaccine.** / 1 dose if you are age 18 to 92 years and a Market researcher living in a residence hall, or have one of several medical conditions. You may also need additional booster doses.  Hepatitis A vaccine.** / Consult your health care provider.  Hepatitis B vaccine.** / Consult your health care provider.  Haemophilus influenzae type b (Hib) vaccine.** / Consult your health care provider. Ages 60 to 51  Blood pressure check.** / Every 1 to 2 years.  Lipid and cholesterol check.** / Every 5 years beginning at age 28.  Lung cancer screening. / Every year if you are aged 7-80 years and have a 30-pack-year history of smoking and currently smoke or have quit within the past 15 years. Yearly screening is stopped once you have quit smoking for at least 15 years or develop a health problem that would prevent you from having lung cancer treatment.  Fecal occult blood test (FOBT) of stool. / Every year beginning at age 18 and continuing until  age 58. You may not have to do this test if you get a colonoscopy every 10 years.  Flexible sigmoidoscopy** or colonoscopy.** / Every 5 years for a flexible sigmoidoscopy or every  10 years for a colonoscopy beginning at age 57 and continuing until age 90.  Hepatitis C blood test.** / For all people born from [birth year] through 1965 and any individual with known risks for hepatitis C.  Skin self-exam. / Monthly.  Influenza vaccine. / Every year.  Tetanus, diphtheria, and acellular pertussis (Tdap/Td) vaccine.** / Consult your health care provider. 1 dose of Td every 10 years.  Varicella vaccine.** / Consult your health care provider.  Zoster vaccine.** / 1 dose for adults aged 22 years or older.  Measles, mumps, rubella (MMR) vaccine.** / You need at least 1 dose of MMR if you were born in 1956 or later. You may also need a second dose.  Pneumococcal 13-valent conjugate (PCV13) vaccine.** / Consult your health care provider.  Pneumococcal polysaccharide (PPSV23) vaccine.** / 1 to 2 doses if you smoke cigarettes or if you have certain conditions.  Meningococcal vaccine.** / Consult your health care provider.  Hepatitis A vaccine.** / Consult your health care provider.  Hepatitis B vaccine.** / Consult your health care provider.  Haemophilus influenzae type b (Hib) vaccine.** / Consult your health care provider. Ages 80 and over  Blood pressure check.** / Every 1 to 2 years.  Lipid and cholesterol check.**/ Every 5 years beginning at age 82.  Lung cancer screening. / Every year if you are aged 25-80 years and have a 30-pack-year history of smoking and currently smoke or have quit within the past 15 years. Yearly screening is stopped once you have quit smoking for at least 15 years or develop a health problem that would prevent you from having lung cancer treatment.  Fecal occult blood test (FOBT) of stool. / Every year beginning at age 74 and continuing until age 64. You may not have to  do this test if you get a colonoscopy every 10 years.  Flexible sigmoidoscopy** or colonoscopy.** / Every 5 years for a flexible sigmoidoscopy or every 10 years for a colonoscopy beginning at age 21 and continuing until age 45.  Hepatitis C blood test.** / For all people born from [birth year] through 1965 and any individual with known risks for hepatitis C.  Abdominal aortic aneurysm (AAA) screening.** / A one-time screening for ages 39 to 13 years who are current or former smokers.  Skin self-exam. / Monthly.  Influenza vaccine. / Every year.  Tetanus, diphtheria, and acellular pertussis (Tdap/Td) vaccine.** / 1 dose of Td every 10 years.  Varicella vaccine.** / Consult your health care provider.  Zoster vaccine.** / 1 dose for adults aged 69 years or older.  Pneumococcal 13-valent conjugate (PCV13) vaccine.** / Consult your health care provider.  Pneumococcal polysaccharide (PPSV23) vaccine.** / 1 dose for all adults aged 34 years and older.  Meningococcal vaccine.** / Consult your health care provider.  Hepatitis A vaccine.** / Consult your health care provider.  Hepatitis B vaccine.** / Consult your health care provider.  Haemophilus influenzae type b (Hib) vaccine.** / Consult your health care provider. **Family history and personal history of risk and conditions may change your health care provider's recommendations. Document Released: 05/22/2001 Document Revised: 03/31/2013 Document Reviewed: 08/21/2010 [facility] Patient Information 2015 [place], [place]. This information is not intended to replace advice given to you by your health care provider. Make sure you discuss any questions you have with your health care provider.

## 2014-07-14 NOTE — Progress Notes (Signed)
Patient presents to clinic today for annual exam.  Patient is fasting for labs.  Acute Concerns: Patient c/o "spot" in left inguinal region that is scaly and red x 2 days.  Endorses mild itch but not significant enough to cause irritation. Denies pain or drainage.  Chronic Issues: Allergic Rhinitis -- well controlled with Flonase daily. Is out of medication and requesting a refill.  Health Maintenance: Dental -- up-to-date Vision -- up-to-date Immunizations -- up-to-date  Past Medical History  Diagnosis Date  . Environmental allergies   . Seasonal allergies     Past Surgical History  Procedure Laterality Date  . Unremarkable.      No current outpatient prescriptions on file prior to visit.   No current facility-administered medications on file prior to visit.    No Known Allergies  Family History  Problem Relation Age of Onset  . Hypertension Father     Living  . Cervical cancer Mother     Living  . Hypertension Paternal Grandmother   . Hypertension Paternal Grandfather   . Stroke Paternal Grandfather   . Healthy Brother     x3  . Healthy Sister     x4  . Healthy Daughter     x1  . Diabetes Maternal Grandmother     History   Social History  . Marital Status: Married    Spouse Name: N/A  . Number of Children: N/A  . Years of Education: N/A   Occupational History  . Not on file.   Social History Main Topics  . Smoking status: Never Smoker   . Smokeless tobacco: Never Used  . Alcohol Use: No  . Drug Use: No  . Sexual Activity: Yes   Other Topics Concern  . Not on file   Social History Narrative   Review of Systems  Constitutional: Negative for fever and weight loss.  HENT: Negative for ear discharge, ear pain, hearing loss and tinnitus.   Eyes: Negative for blurred vision, double vision, photophobia and pain.  Respiratory: Negative for cough and shortness of breath.   Cardiovascular: Negative for chest pain and palpitations.    Gastrointestinal: Negative for heartburn, nausea, vomiting, abdominal pain, diarrhea, constipation, blood in stool and melena.  Genitourinary: Negative for dysuria, urgency, frequency, hematuria and flank pain.  Musculoskeletal: Negative for falls.  Neurological: Negative for dizziness, loss of consciousness and headaches.  Endo/Heme/Allergies: Negative for environmental allergies.  Psychiatric/Behavioral: Negative for depression, suicidal ideas, hallucinations and substance abuse. The patient is not nervous/anxious and does not have insomnia.    BP 134/82 mmHg  Pulse 82  Temp(Src) 97.9 F (36.6 C) (Oral)  Resp 16  Ht 5\' 6"  (1.676 m)  Wt 231 lb 4 oz (104.894 kg)  BMI 37.34 kg/m2  SpO2 97%  Physical Exam  Constitutional: He is oriented to person, place, and time and well-developed, well-nourished, and in no distress.  HENT:  Head: Normocephalic and atraumatic.  Right Ear: External ear normal.  Left Ear: External ear normal.  Nose: Nose normal.  Mouth/Throat: Oropharynx is clear and moist. No oropharyngeal exudate.  Eyes: Conjunctivae and EOM are normal. Pupils are equal, round, and reactive to light.  Neck: Neck supple. No thyromegaly present.  Cardiovascular: Normal rate, regular rhythm, normal heart sounds and intact distal pulses.   Pulmonary/Chest: Effort normal and breath sounds normal. No respiratory distress. He has no wheezes. He has no rales. He exhibits no tenderness.  Abdominal: Soft. Bowel sounds are normal. He exhibits no distension and no  mass. There is no tenderness. There is no rebound and no guarding.  Genitourinary: Testes/scrotum normal and penis normal. No discharge found.  Lymphadenopathy:    He has no cervical adenopathy.  Neurological: He is alert and oriented to person, place, and time.  Skin: Skin is warm and dry.     Psychiatric: Affect normal.  Vitals reviewed.  Assessment/Plan: Tinea cruris Rx Lotrisone ointment twice daily for 2  weeks. Supportive measures discussed. Follow-up if symptoms are not improving.   Visit for preventive health examination I have reviewed the patient's medical history in detail and updated the computerized patient record.  Immunizations are up-to-date. Preventive care discussed today.  Handout given.  Will obtain fasting labs today to include one-time HIV screening per USPTF Grade A recommendations.     Allergic rhinitis Stable. Continue Flonase. Medication refilled.

## 2014-07-14 NOTE — Assessment & Plan Note (Signed)
Rx Lotrisone ointment twice daily for 2 weeks. Supportive measures discussed. Follow-up if symptoms are not improving.

## 2014-07-14 NOTE — Assessment & Plan Note (Signed)
Stable. Continue Flonase. Medication refilled.

## 2014-07-15 ENCOUNTER — Encounter: Payer: Self-pay | Admitting: Physician Assistant

## 2015-01-24 NOTE — Telephone Encounter (Signed)
No entry 

## 2015-04-26 ENCOUNTER — Encounter (HOSPITAL_COMMUNITY): Payer: Self-pay | Admitting: Emergency Medicine

## 2015-04-26 ENCOUNTER — Emergency Department (HOSPITAL_COMMUNITY)
Admission: EM | Admit: 2015-04-26 | Discharge: 2015-04-26 | Disposition: A | Discharge status: Home / Self Care | Payer: 59

## 2015-04-26 ENCOUNTER — Emergency Department (INDEPENDENT_AMBULATORY_CARE_PROVIDER_SITE_OTHER)
Admission: EM | Admit: 2015-04-26 | Discharge: 2015-04-26 | Disposition: A | Discharge status: Home / Self Care | Payer: Self-pay | Source: Home / Self Care | Attending: Emergency Medicine | Admitting: Emergency Medicine

## 2015-04-26 DIAGNOSIS — M25511 Pain in right shoulder: Secondary | ICD-10-CM

## 2015-04-26 DIAGNOSIS — M546 Pain in thoracic spine: Secondary | ICD-10-CM

## 2015-04-26 DIAGNOSIS — M549 Dorsalgia, unspecified: Secondary | ICD-10-CM

## 2015-04-26 DIAGNOSIS — R0789 Other chest pain: Secondary | ICD-10-CM

## 2015-04-26 MED ORDER — CYCLOBENZAPRINE HCL 10 MG PO TABS: 10.0000 mg | ORAL_TABLET | Freq: Three times a day (TID) | ORAL | Status: DC | PRN

## 2015-04-26 MED ORDER — TRAMADOL HCL 50 MG PO TABS: 50.0000 mg | ORAL_TABLET | Freq: Four times a day (QID) | ORAL | Status: DC | PRN

## 2015-04-26 MED ORDER — IBUPROFEN 600 MG PO TABS: 600.0000 mg | ORAL_TABLET | Freq: Four times a day (QID) | ORAL | Status: DC | PRN
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
## 2015-04-26 NOTE — ED Notes (Signed)
Pt stated "i'm going to urgent care"

## 2015-05-03 ENCOUNTER — Encounter: Payer: Self-pay | Admitting: Physician Assistant

## 2015-05-03 ENCOUNTER — Ambulatory Visit (INDEPENDENT_AMBULATORY_CARE_PROVIDER_SITE_OTHER): Payer: Self-pay | Admitting: Physician Assistant

## 2015-05-03 VITALS — BP 124/80 | HR 77 | Temp 98.2°F | Ht 66.0 in | Wt 237.2 lb

## 2015-05-03 DIAGNOSIS — M545 Low back pain, unspecified: Secondary | ICD-10-CM | POA: Insufficient documentation

## 2015-05-03 MED ORDER — MELOXICAM 15 MG PO TABS: 15.0000 mg | ORAL_TABLET | Freq: Every day | ORAL | Status: DC

## 2015-05-03 NOTE — Patient Instructions (Signed)
Please limit heavy lifting.  Apply Salon Pas to the area. You can also apply heat to the area (not at the same time as Salon Pas) for 10-15 minutes to help relax muscles. Take the Mobic once daily with food. Continue the Flexeril at bedtime.  Symptoms should resolve over the next 5-7 days.
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
## 2015-06-07 ENCOUNTER — Telehealth: Payer: Self-pay | Admitting: Physician Assistant
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
## 2015-07-28 ENCOUNTER — Emergency Department (HOSPITAL_COMMUNITY): Payer: 59

## 2015-07-28 ENCOUNTER — Encounter (HOSPITAL_COMMUNITY): Payer: Self-pay

## 2015-07-28 DIAGNOSIS — R0789 Other chest pain: Secondary | ICD-10-CM | POA: Diagnosis not present

## 2015-07-28 DIAGNOSIS — Z79899 Other long term (current) drug therapy: Secondary | ICD-10-CM | POA: Insufficient documentation

## 2015-07-28 DIAGNOSIS — E663 Overweight: Secondary | ICD-10-CM | POA: Diagnosis not present

## 2015-07-28 DIAGNOSIS — R079 Chest pain, unspecified: Secondary | ICD-10-CM | POA: Diagnosis present

## 2015-07-28 LAB — CBC
HCT: 43.6 % (ref 39.0–52.0)
Hemoglobin: 13.9 g/dL (ref 13.0–17.0)
MCH: 28.7 pg (ref 26.0–34.0)
MCHC: 31.9 g/dL (ref 30.0–36.0)
MCV: 90.1 fL (ref 78.0–100.0)
PLATELETS: 263 10*3/uL (ref 150–400)
RBC: 4.84 MIL/uL (ref 4.22–5.81)
RDW: 13.3 % (ref 11.5–15.5)
WBC: 7.2 10*3/uL (ref 4.0–10.5)

## 2015-07-28 LAB — BASIC METABOLIC PANEL
Anion gap: 10 (ref 5–15)
BUN: 12 mg/dL (ref 6–20)
CHLORIDE: 104 mmol/L (ref 101–111)
CO2: 24 mmol/L (ref 22–32)
CREATININE: 0.94 mg/dL (ref 0.61–1.24)
Calcium: 9.5 mg/dL (ref 8.9–10.3)
Glucose, Bld: 124 mg/dL — ABNORMAL HIGH (ref 65–99)
POTASSIUM: 3.5 mmol/L (ref 3.5–5.1)
SODIUM: 138 mmol/L (ref 135–145)

## 2015-07-28 LAB — I-STAT TROPONIN, ED: TROPONIN I, POC: 0 ng/mL (ref 0.00–0.08)
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
## 2015-07-29 ENCOUNTER — Emergency Department (HOSPITAL_COMMUNITY)
Admission: EM | Admit: 2015-07-29 | Discharge: 2015-07-29 | Disposition: A | Discharge status: Home / Self Care | Payer: 59 | Attending: Emergency Medicine | Admitting: Emergency Medicine

## 2015-07-29 DIAGNOSIS — R0789 Other chest pain: Secondary | ICD-10-CM

## 2015-07-29 MED ORDER — KETOROLAC TROMETHAMINE 30 MG/ML IJ SOLN
30.0000 mg | Freq: Once | INTRAMUSCULAR | Status: AC
  Administered 2015-07-29: 30 mg via INTRAMUSCULAR
  Filled 2015-07-29: qty 1

## 2015-07-29 MED ORDER — NAPROXEN 500 MG PO TABS: 500.0000 mg | ORAL_TABLET | Freq: Two times a day (BID) | ORAL | Status: DC
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
## 2015-07-29 NOTE — Discharge Instructions (Signed)

## 2015-09-01 ENCOUNTER — Ambulatory Visit (INDEPENDENT_AMBULATORY_CARE_PROVIDER_SITE_OTHER): Payer: 59 | Admitting: Medical

## 2015-09-01 ENCOUNTER — Encounter: Payer: Self-pay | Admitting: Medical

## 2015-09-01 VITALS — BP 122/72 | HR 68 | Temp 98.0°F | Ht 66.0 in | Wt 241.0 lb

## 2015-09-01 DIAGNOSIS — R0789 Other chest pain: Secondary | ICD-10-CM | POA: Diagnosis not present

## 2015-09-01 DIAGNOSIS — K219 Gastro-esophageal reflux disease without esophagitis: Secondary | ICD-10-CM | POA: Diagnosis not present

## 2015-09-01 LAB — TROPONIN I: Troponin I: <0.01 ng/mL (ref <=0.05)

## 2015-09-01 MED ORDER — RANITIDINE HCL 150 MG PO CAPS: 150.0000 mg | ORAL_CAPSULE | Freq: Two times a day (BID) | ORAL | Status: DC

## 2015-09-01 MED ORDER — ADULT MULTIVITAMIN W/MINERALS CH: 1.0000 | ORAL_TABLET | Freq: Every day | ORAL | Status: DC

## 2015-09-01 MED ORDER — GI COCKTAIL ~~LOC~~
30.0000 mL | Freq: Once | ORAL | Status: AC
  Administered 2015-09-01: 30 mL via ORAL

## 2015-09-01 NOTE — Addendum Note (Signed)
Addended by: Gwenevere AbbotSAGUIER, Marquisa Salih M on: 09/01/2015 04:26 PM   Modules accepted: Orders

## 2015-09-01 NOTE — Patient Instructions (Addendum)
You do appear to have heartburn type symptoms(although not completley classic type symptoms).  Will rx ranitidine. Also want you to start healthy diet.  Your ekg looked normal today. No significant changes compared to prior.   Will get labs today to include h pylori test and troponin.  Follow up in 2 weeks or as needed. May rx different stomach med on follow up.  If any severe mid chest type pain then ED evaluation.  Food Choices for Gastroesophageal Reflux Disease, Adult When you have gastroesophageal reflux disease (GERD), the foods you eat and your eating habits are very important. Choosing the right foods can help ease the discomfort of GERD. WHAT GENERAL GUIDELINES DO I NEED TO FOLLOW?  Choose fruits, vegetables, whole grains, low-fat dairy products, and low-fat meat, fish, and poultry.  Limit fats such as oils, salad dressings, butter, nuts, and avocado.  Keep a food diary to identify foods that cause symptoms.  Avoid foods that cause reflux. These may be different for different people.  Eat frequent small meals instead of three large meals each day.  Eat your meals slowly, in a relaxed setting.  Limit fried foods.  Cook foods using methods other than frying.  Avoid drinking alcohol.  Avoid drinking large amounts of liquids with your meals.  Avoid bending over or lying down until 2-3 hours after eating. WHAT FOODS ARE NOT RECOMMENDED? The following are some foods and drinks that may worsen your symptoms: Vegetables Tomatoes. Tomato juice. Tomato and spaghetti sauce. Chili peppers. Onion and garlic. Horseradish. Fruits Oranges, grapefruit, and lemon (fruit and juice). Meats High-fat meats, fish, and poultry. This includes hot dogs, ribs, ham, sausage, salami, and bacon. Dairy Whole milk and chocolate milk. Sour cream. Cream. Butter. Ice cream. Cream cheese.  Beverages Coffee and tea, with or without caffeine. Carbonated beverages or energy drinks. Condiments Hot  sauce. Barbecue sauce.  Sweets/Desserts Chocolate and cocoa. Donuts. Peppermint and spearmint. Fats and Oils High-fat foods, including Jamaica[place] fries and potato chips. Other Vinegar. Strong spices, such as black pepper, white pepper, red pepper, cayenne, curry powder, cloves, ginger, and chili powder. The items listed above may not be a complete list of foods and beverages to avoid. Contact your dietitian for more information.   This information is not intended to replace advice given to you by your health care provider. Make sure you discuss any questions you have with your health care provider.   Document Released: 03/26/2005 Document Revised: 04/16/2014 Document Reviewed: 01/28/2013 Elsevier Interactive Patient Education [employer]2016 [employer] Inc.

## 2015-09-01 NOTE — Addendum Note (Signed)
Addended by: Harley AltoPRICE, Takya Vandivier M on: 09/01/2015 04:37 PM   Modules accepted: Orders

## 2015-09-01 NOTE — Progress Notes (Signed)
Subjective:    Patient ID: Glenn Butler, male    DOB: [birth year], 35 y.o.   MRN: [id number]  HPI  Pt in with some with some months of discomfort lower chest region. He thinks it is reflux. Pt does admits some heartburn but he then states it isn't. Then clarifies that may be pain in espophugus. Pt states he tries tums type product and did not help. No other meds otc. Pt does report belching at times. Pt at cook out today and french fries. He think that made his symptoms worse.Pt had pain for about 3 days. He almost has pain every day. Occasional will get symptom free day.   End of last month had some similar symptom. Reviewed studies and work up was negative for chest pain.  Ekg was negative.   With his discomfort he has no jaw pain. No arm pain. No sweating and no sob. No wheezing.    Review of Systems  Constitutional: Negative for fever, chills and fatigue.  Respiratory: Negative for cough, chest tightness, shortness of breath and wheezing.   Cardiovascular: Negative for chest pain and palpitations.  Gastrointestinal: Positive for abdominal pain. Negative for nausea, vomiting, diarrhea and blood in stool.  Musculoskeletal: Negative for back pain.  Skin: Negative for rash.  Neurological: Negative for dizziness and headaches.  Hematological: Negative for adenopathy. Does not bruise/bleed easily.  Psychiatric/Behavioral: Negative for behavioral problems and confusion.    Past Medical History  Diagnosis Date  . Environmental allergies   . Seasonal allergies      Social History   Social History  . Marital Status: Married    Spouse Name: N/A  . Number of Children: N/A  . Years of Education: N/A   Occupational History  . Not on file.   Social History Main Topics  . Smoking status: Never Smoker   . Smokeless tobacco: Never Used  . Alcohol Use: No  . Drug Use: No  . Sexual Activity: Yes   Other Topics Concern  . Not on file   Social History Narrative    Past  Surgical History  Procedure Laterality Date  . Unremarkable.      Family History  Problem Relation Age of Onset  . Hypertension Father     Living  . Cervical cancer Mother     Living  . Hypertension Paternal Grandmother   . Hypertension Paternal Grandfather   . Stroke Paternal Grandfather   . Healthy Brother     x3  . Healthy Sister     x4  . Healthy Daughter     x1  . Diabetes Maternal Grandmother     No Known Allergies  Current Outpatient Prescriptions on File Prior to Visit  Medication Sig Dispense Refill  . Multiple Vitamin (MULTIVITAMIN WITH MINERALS) TABS tablet Take 1 tablet by mouth daily.    . naproxen (NAPROSYN) 500 MG tablet Take 1 tablet (500 mg total) by mouth 2 (two) times daily. 30 tablet 0   No current facility-administered medications on file prior to visit.    BP 122/72 mmHg  Pulse 68  Temp(Src) 98 F (36.7 C) (Oral)  Ht  (1.676 m)  Wt 241 lb (109.317 kg)  BMI 38.92 kg/m2  SpO2 98%        Objective:   Physical Exam  General Appearance- Not in acute distress.  HEENT Eyes- Scleraeral/Conjuntiva-bilat- Not Yellow. Mouth & Throat- Normal.  Chest and Lung Exam Auscultation: Breath sounds:-Normal. Adventitious sounds:- No Adventitious sounds.  Cardiovascular Auscultation:Rythm -  Regular. Heart Sounds -Normal heart sounds.  Abdomen Inspection:-Inspection Normal.  Palpation/Perucssion: Palpation and Percussion of the abdomen reveal- faint epigastric  Tender, No Rebound tenderness, No rigidity(Guarding) and No Palpable abdominal masses.  Liver:-Normal.  Spleen:- Normal.   Back- no cva tenderness.      Assessment & Plan:  You do appear to have heartburn type symptoms(although not completley classic type symptoms).  Will rx ranitidine. Also want you to start healthy diet.(gi cocktail given in office today)  Your ekg looked normal today. No significant changes compared to prior.   Will get labs today to include h pylori test  and troponin.  Follow up in 2 weeks or as needed. May rx different stomach med on follow up.  If any severe mid chest type pain then ED evaluation.   Cobie Leidner, Ramon DredgeEdward, PA-C

## 2015-09-01 NOTE — Progress Notes (Signed)
Pre visit review using our clinic review tool, if applicable. No additional management support is needed unless otherwise documented below in the visit note. 

## 2015-09-01 NOTE — Addendum Note (Signed)
Addended by: Neldon LabellaMABE, HOLDEN S on: 09/01/2015 05:49 PM   Modules accepted: Orders

## 2015-09-02 LAB — H. PYLORI BREATH TEST: H. pylori Breath Test: DETECTED — AB

## 2015-09-04 ENCOUNTER — Telehealth: Payer: Self-pay | Admitting: Medical

## 2015-09-04 MED ORDER — OMEPRAZOLE 20 MG PO CPDR: 20.0000 mg | DELAYED_RELEASE_CAPSULE | Freq: Every day | ORAL | Status: DC

## 2015-09-04 MED ORDER — CLARITHROMYCIN 500 MG PO TABS: 500.0000 mg | ORAL_TABLET | Freq: Two times a day (BID) | ORAL | Status: DC

## 2015-09-04 MED ORDER — AMOXICILLIN 500 MG PO CAPS: ORAL_CAPSULE | ORAL | Status: DC

## 2015-09-04 NOTE — Telephone Encounter (Signed)
rx of antibiotic and omeprazole sent to his pharmacy.

## 2015-10-03 ENCOUNTER — Telehealth: Payer: Self-pay | Admitting: Medical

## 2015-10-03 NOTE — Telephone Encounter (Signed)
Patients Rx called to [facility]ed [facility] times no answer. Will call again.

## 2015-10-03 NOTE — Telephone Encounter (Signed)
Will you let our pharmacy know that they can give zantac tablets instead of capsules.

## 2015-12-07 ENCOUNTER — Ambulatory Visit (INDEPENDENT_AMBULATORY_CARE_PROVIDER_SITE_OTHER): Payer: 59 | Admitting: Physician Assistant

## 2015-12-07 ENCOUNTER — Encounter: Payer: Self-pay | Admitting: Physician Assistant

## 2015-12-07 VITALS — BP 132/70 | HR 84 | Temp 98.0°F | Resp 16 | Wt 235.0 lb

## 2015-12-07 DIAGNOSIS — M722 Plantar fascial fibromatosis: Secondary | ICD-10-CM | POA: Diagnosis not present

## 2015-12-07 DIAGNOSIS — S93402A Sprain of unspecified ligament of left ankle, initial encounter: Secondary | ICD-10-CM

## 2015-12-07 MED ORDER — MELOXICAM 15 MG PO TABS: 15.0000 mg | ORAL_TABLET | Freq: Every day | ORAL | 0 refills | Status: DC

## 2015-12-07 NOTE — Progress Notes (Signed)
   Patient presents to clinic today c/o pain in R ankle over the past month after fall down stairs. Endorses was able to get up an move around after fall. Pain developed later and is aching in nature. Denies swelling or bruising to extremity. Able to ambulate without difficulty. Notes heel pain as well over past few months, noting the first step in the morning is painful. Pain improves after that but persists through most of the day. Has not taken anything for symptoms.   Past Medical History:  Diagnosis Date  . Environmental allergies   . Seasonal allergies     Current Outpatient Prescriptions on File Prior to Visit  Medication Sig Dispense Refill  . Multiple Vitamin (MULTIVITAMIN WITH MINERALS) TABS tablet Take 1 tablet by mouth daily. 30 tablet 11  . ranitidine (ZANTAC) 150 MG capsule Take 1 capsule (150 mg total) by mouth 2 (two) times daily. 60 capsule 0   No current facility-administered medications on file prior to visit.     No Known Allergies  Family History  Problem Relation Age of Onset  . Hypertension Father     Living  . Cervical cancer Mother     Living  . Hypertension Paternal Grandmother   . Hypertension Paternal Grandfather   . Stroke Paternal Grandfather   . Healthy Brother     x3  . Healthy Sister     x4  . Healthy Daughter     x1  . Diabetes Maternal Grandmother     Social History   Social History  . Marital status: Married    Spouse name: N/A  . Number of children: N/A  . Years of education: N/A   Social History Main Topics  . Smoking status: Never Smoker  . Smokeless tobacco: Never Used  . Alcohol use No  . Drug use: No  . Sexual activity: Yes   Other Topics Concern  . None   Social History Narrative  . None    Review of Systems - See HPI.  All other ROS are negative.  BP 132/70   Pulse 84   Temp 98 F (36.7 C) (Oral)   Resp 16   Wt 235 lb (106.6 kg)   SpO2 99%   BMI 37.93 kg/m   Physical Exam  Constitutional: He is  well-developed, well-nourished, and in no distress.  Cardiovascular: Normal rate, regular rhythm, normal heart sounds and intact distal pulses.   Pulmonary/Chest: Effort normal.  Musculoskeletal:       Left ankle: He exhibits normal range of motion and no swelling. Tenderness. AITFL tenderness found. Achilles tendon normal.  Pain with palpation over plantar fasciia  Skin: Skin is warm and dry. No rash noted.  Psychiatric: Affect normal.  Vitals reviewed.  Assessment/Plan: 1. Sprain of ankle, left, initial encounter Mild. Ankle brace applied. Rx Mobic. Supportive measures reviewed. FU if not resolving.  2. Plantar fasciitis of left foot Rx Mobic. He has been encouraged to get arch supports for his shoes. Cold can exercises as directed. Fu with podiatry if not resolving.   Piedad ClimesMartin, Dama Hedgepeth Cody, PA-C

## 2015-12-07 NOTE — Patient Instructions (Signed)
Please wear the ankle brace during the day over the next 2 weeks. Get an arch support for your shoes. Take Mobic once daily with food. Always wear supportive shoes. Try the cold can exercises we discussed.  If symptoms are not resolving, I recommend we set you up with Podiatry.

## 2016-01-25 ENCOUNTER — Ambulatory Visit (INDEPENDENT_AMBULATORY_CARE_PROVIDER_SITE_OTHER): Payer: 59 | Admitting: Physician Assistant

## 2016-01-25 ENCOUNTER — Encounter: Payer: Self-pay | Admitting: Physician Assistant

## 2016-01-25 VITALS — BP 133/75 | HR 87 | Temp 98.0°F | Resp 16 | Ht 66.0 in | Wt 229.4 lb

## 2016-01-25 DIAGNOSIS — Z0000 Encounter for general adult medical examination without abnormal findings: Secondary | ICD-10-CM | POA: Diagnosis not present

## 2016-01-25 MED ORDER — ADULT MULTIVITAMIN W/MINERALS CH: 1.0000 | ORAL_TABLET | Freq: Every day | ORAL | 11 refills | Status: DC

## 2016-01-25 MED ORDER — RANITIDINE HCL 150 MG PO CAPS: 150.0000 mg | ORAL_CAPSULE | Freq: Two times a day (BID) | ORAL | 3 refills | Status: DC

## 2016-01-25 NOTE — Progress Notes (Signed)
Patient presents to clinic today for annual exam.  Patient is fasting for labs. Body mass index is 37.02 kg/m. Walking 20,000 steps per day. Patient denies acute concerns today.  Health Maintenance: Immunizations -- Tetanus up-to-date. Declines flu shot.  Past Medical History:  Diagnosis Date  . Environmental allergies   . Seasonal allergies     Past Surgical History:  Procedure Laterality Date  . Unremarkable.      Current Outpatient Prescriptions on File Prior to Visit  Medication Sig Dispense Refill  . meloxicam (MOBIC) 15 MG tablet Take 1 tablet (15 mg total) by mouth daily. 20 tablet 0   No current facility-administered medications on file prior to visit.     No Known Allergies  Family History  Problem Relation Age of Onset  . Hypertension Father     Living  . Cervical cancer Mother     Living  . Hypertension Paternal Grandmother   . Hypertension Paternal Grandfather   . Stroke Paternal Grandfather   . Healthy Brother     x3  . Healthy Sister     x4  . Healthy Daughter     x1  . Diabetes Maternal Grandmother     Social History   Social History  . Marital status: Married    Spouse name: N/A  . Number of children: N/A  . Years of education: N/A   Occupational History  . Not on file.   Social History Main Topics  . Smoking status: Never Smoker  . Smokeless tobacco: Never Used  . Alcohol use No  . Drug use: No  . Sexual activity: Yes   Other Topics Concern  . Not on file   Social History Narrative  . No narrative on file    Review of Systems  Constitutional: Negative for fever and weight loss.  HENT: Negative for ear discharge, ear pain, hearing loss and tinnitus.   Eyes: Negative for blurred vision, double vision, photophobia and pain.  Respiratory: Negative for cough and shortness of breath.   Cardiovascular: Negative for chest pain and palpitations.  Gastrointestinal: Negative for abdominal pain, blood in stool, constipation,  diarrhea, heartburn, melena, nausea and vomiting.  Genitourinary: Negative for dysuria, flank pain, frequency, hematuria and urgency.  Musculoskeletal: Negative for falls.  Neurological: Negative for dizziness, loss of consciousness and headaches.  Endo/Heme/Allergies: Negative for environmental allergies.  Psychiatric/Behavioral: Negative for depression, hallucinations, substance abuse and suicidal ideas. The patient is not nervous/anxious and does not have insomnia.     BP 133/75 (BP Location: Left Arm, Patient Position: Sitting, Cuff Size: Large)   Pulse 87   Temp 98 F (36.7 C) (Oral)   Resp 16   Ht 5\' 6"  (1.676 m)   Wt 229 lb 6 oz (104 kg)   SpO2 98%   BMI 37.02 kg/m   Physical Exam  Constitutional: He is oriented to person, place, and time and well-developed, well-nourished, and in no distress.  HENT:  Head: Normocephalic and atraumatic.  Right Ear: External ear normal.  Left Ear: External ear normal.  Nose: Nose normal.  Mouth/Throat: Oropharynx is clear and moist. No oropharyngeal exudate.  Eyes: Conjunctivae and EOM are normal. Pupils are equal, round, and reactive to light.  Neck: Neck supple. No thyromegaly present.  Cardiovascular: Normal rate, regular rhythm, normal heart sounds and intact distal pulses.   Pulmonary/Chest: Effort normal and breath sounds normal. No respiratory distress. He has no wheezes. He has no rales. He exhibits no tenderness.  Abdominal: Soft.  Bowel sounds are normal. He exhibits no distension and no mass. There is no tenderness. There is no rebound and no guarding.  Genitourinary: Testes/scrotum normal.  Lymphadenopathy:    He has no cervical adenopathy.  Neurological: He is alert and oriented to person, place, and time.  Skin: Skin is warm and dry. No rash noted.  Psychiatric: Affect normal.  Vitals reviewed.  Assessment/Plan: Visit for preventive health examination Depression screen negative. Health Maintenance reviewed -- Declines flu  shot. Tetanus up-to-date. Preventive schedule discussed and handout given in AVS. Patient to return for fasting labs. Orders placed.    Piedad ClimesMartin, Breyden Jeudy Cody, PA-C

## 2016-01-25 NOTE — Patient Instructions (Signed)
Please schedule an appointment for fasting labs. I will call with your results.  Our office will call you with your results unless you have chosen to receive results via MyChart.  If your blood work is normal we will follow-up each year for physicals and as scheduled for chronic medical problems.  If anything is abnormal we will treat accordingly and get you in for a follow-up.  Continue medications as directed.  Preventive Care for Adults, Male A healthy lifestyle and preventive care can promote health and wellness. Preventive health guidelines for men include the following key practices:  A routine yearly physical is a good way to check with your health care provider about your health and preventative screening. It is a chance to share any concerns and updates on your health and to receive a thorough exam.  Visit your dentist for a routine exam and preventative care every 6 months. Brush your teeth twice a day and floss once a day. Good oral hygiene prevents tooth decay and gum disease.  The frequency of eye exams is based on your age, health, family medical history, use of contact lenses, and other factors. Follow your health care provider's recommendations for frequency of eye exams.  Eat a healthy diet. Foods such as vegetables, fruits, whole grains, low-fat dairy products, and lean protein foods contain the nutrients you need without too many calories. Decrease your intake of foods high in solid fats, added sugars, and salt. Eat the right amount of calories for you.Get information about a proper diet from your health care provider, if necessary.  Regular physical exercise is one of the most important things you can do for your health. Most adults should get at least 150 minutes of moderate-intensity exercise (any activity that increases your heart rate and causes you to sweat) each week. In addition, most adults need muscle-strengthening exercises on 2 or more days a week.  Maintain a  healthy weight. The body mass index (BMI) is a screening tool to identify possible weight problems. It provides an estimate of body fat based on height and weight. Your health care provider can find your BMI and can help you achieve or maintain a healthy weight.For adults 20 years and older:  A BMI below 18.5 is considered underweight.  A BMI of 18.5 to 24.9 is normal.  A BMI of 25 to 29.9 is considered overweight.  A BMI of 30 and above is considered obese.  Maintain normal blood lipids and cholesterol levels by exercising and minimizing your intake of saturated fat. Eat a balanced diet with plenty of fruit and vegetables. Blood tests for lipids and cholesterol should begin at age 69 and be repeated every 5 years. If your lipid or cholesterol levels are high, you are over 50, or you are at high risk for heart disease, you may need your cholesterol levels checked more frequently.Ongoing high lipid and cholesterol levels should be treated with medicines if diet and exercise are not working.  If you smoke, find out from your health care provider how to quit. If you do not use tobacco, do not start.  Lung cancer screening is recommended for adults aged 41-80 years who are at high risk for developing lung cancer because of a history of smoking. A yearly low-dose CT scan of the lungs is recommended for people who have at least a 30-pack-year history of smoking and are a current smoker or have quit within the past 15 years. A pack year of smoking is smoking an average  of 1 pack of cigarettes a day for 1 year (for example: 1 pack a day for 30 years or 2 packs a day for 15 years). Yearly screening should continue until the smoker has stopped smoking for at least 15 years. Yearly screening should be stopped for people who develop a health problem that would prevent them from having lung cancer treatment.  If you choose to drink alcohol, do not have more than 2 drinks per day. One drink is considered to be  12 ounces (355 mL) of beer, 5 ounces (148 mL) of wine, or 1.5 ounces (44 mL) of liquor.  Avoid use of street drugs. Do not share needles with anyone. Ask for help if you need support or instructions about stopping the use of drugs.  High blood pressure causes heart disease and increases the risk of stroke. Your blood pressure should be checked at least every 1-2 years. Ongoing high blood pressure should be treated with medicines, if weight loss and exercise are not effective.  If you are 76-8 years old, ask your health care provider if you should take aspirin to prevent heart disease.  Diabetes screening is done by taking a blood sample to check your blood glucose level after you have not eaten for a certain period of time (fasting). If you are not overweight and you do not have risk factors for diabetes, you should be screened once every 3 years starting at age 62. If you are overweight or obese and you are 40-30 years of age, you should be screened for diabetes every year as part of your cardiovascular risk assessment.  Colorectal cancer can be detected and often prevented. Most routine colorectal cancer screening begins at the age of 73 and continues through age 63. However, your health care provider may recommend screening at an earlier age if you have risk factors for colon cancer. On a yearly basis, your health care provider may provide home test kits to check for hidden blood in the stool. Use of a small camera at the end of a tube to directly examine the colon (sigmoidoscopy or colonoscopy) can detect the earliest forms of colorectal cancer. Talk to your health care provider about this at age 19, when routine screening begins. Direct exam of the colon should be repeated every 5-10 years through age 73, unless early forms of precancerous polyps or small growths are found.  People who are at an increased risk for hepatitis B should be screened for this virus. You are considered at high risk for  hepatitis B if:  You were born in a country where hepatitis B occurs often. Talk with your health care provider about which countries are considered high risk.  Your parents were born in a high-risk country and you have not received a shot to protect against hepatitis B (hepatitis B vaccine).  You have HIV or AIDS.  You use needles to inject street drugs.  You live with, or have sex with, someone who has hepatitis B.  You are a man who has sex with other men (MSM).  You get hemodialysis treatment.  You take certain medicines for conditions such as cancer, organ transplantation, and autoimmune conditions.  Hepatitis C blood testing is recommended for all people born from [birth year] through 1965 and any individual with known risks for hepatitis C.  Practice safe sex. Use condoms and avoid high-risk sexual practices to reduce the spread of sexually transmitted infections (STIs). STIs include gonorrhea, chlamydia, syphilis, trichomonas, herpes, HPV, and human immunodeficiency  virus (HIV). Herpes, HIV, and HPV are viral illnesses that have no cure. They can result in disability, cancer, and death.  If you are a man who has sex with other men, you should be screened at least once per year for:  HIV.  Urethral, rectal, and pharyngeal infection of gonorrhea, chlamydia, or both.  If you are at risk of being infected with HIV, it is recommended that you take a prescription medicine daily to prevent HIV infection. This is called preexposure prophylaxis (PrEP). You are considered at risk if:  You are a man who has sex with other men (MSM) and have other risk factors.  You are a heterosexual man, are sexually active, and are at increased risk for HIV infection.  You take drugs by injection.  You are sexually active with a partner who has HIV.  Talk with your health care provider about whether you are at high risk of being infected with HIV. If you choose to begin PrEP, you should first be tested  for HIV. You should then be tested every 3 months for as long as you are taking PrEP.  A one-time screening for abdominal aortic aneurysm (AAA) and surgical repair of large AAAs by ultrasound are recommended for men ages 50 to 71 years who are current or former smokers.  Healthy men should no longer receive prostate-specific antigen (PSA) blood tests as part of routine cancer screening. Talk with your health care provider about prostate cancer screening.  Testicular cancer screening is not recommended for adult males who have no symptoms. Screening includes self-exam, a health care provider exam, and other screening tests. Consult with your health care provider about any symptoms you have or any concerns you have about testicular cancer.  Use sunscreen. Apply sunscreen liberally and repeatedly throughout the day. You should seek shade when your shadow is shorter than you. Protect yourself by wearing long sleeves, pants, a wide-brimmed hat, and sunglasses year round, whenever you are outdoors.  Once a month, do a whole-body skin exam, using a mirror to look at the skin on your back. Tell your health care provider about new moles, moles that have irregular borders, moles that are larger than a pencil eraser, or moles that have changed in shape or color.  Stay current with required vaccines (immunizations).  Influenza vaccine. All adults should be immunized every year.  Tetanus, diphtheria, and acellular pertussis (Td, Tdap) vaccine. An adult who has not previously received Tdap or who does not know his vaccine status should receive 1 dose of Tdap. This initial dose should be followed by tetanus and diphtheria toxoids (Td) booster doses every 10 years. Adults with an unknown or incomplete history of completing a 3-dose immunization series with Td-containing vaccines should begin or complete a primary immunization series including a Tdap dose. Adults should receive a Td booster every 10  years.  Varicella vaccine. An adult without evidence of immunity to varicella should receive 2 doses or a second dose if he has previously received 1 dose.  Human papillomavirus (HPV) vaccine. Males aged 11-21 years who have not received the vaccine previously should receive the 3-dose series. Males aged 22-26 years may be immunized. Immunization is recommended through the age of 48 years for any male who has sex with males and did not get any or all doses earlier. Immunization is recommended for any person with an immunocompromised condition through the age of 34 years if he did not get any or all doses earlier. During the 3-dose  series, the second dose should be obtained 4-8 weeks after the first dose. The third dose should be obtained 24 weeks after the first dose and 16 weeks after the second dose.  Zoster vaccine. One dose is recommended for adults aged 57 years or older unless certain conditions are present.  Measles, mumps, and rubella (MMR) vaccine. Adults born before 34 generally are considered immune to measles and mumps. Adults born in [birth year] or later should have 1 or more doses of MMR vaccine unless there is a contraindication to the vaccine or there is laboratory evidence of immunity to each of the three diseases. A routine second dose of MMR vaccine should be obtained at least 28 days after the first dose for students attending postsecondary schools, health care workers, or international travelers. People who received inactivated measles vaccine or an unknown type of measles vaccine during 1963-1967 should receive 2 doses of MMR vaccine. People who received inactivated mumps vaccine or an unknown type of mumps vaccine before 1979 and are at high risk for mumps infection should consider immunization with 2 doses of MMR vaccine. Unvaccinated health care workers born before [birth year] who lack laboratory evidence of measles, mumps, or rubella immunity or laboratory confirmation of disease should  consider measles and mumps immunization with 2 doses of MMR vaccine or rubella immunization with 1 dose of MMR vaccine.  Pneumococcal 13-valent conjugate (PCV13) vaccine. When indicated, a person who is uncertain of his immunization history and has no record of immunization should receive the PCV13 vaccine. All adults 15 years of age and older should receive this vaccine. An adult aged 20 years or older who has certain medical conditions and has not been previously immunized should receive 1 dose of PCV13 vaccine. This PCV13 should be followed with a dose of pneumococcal polysaccharide (PPSV23) vaccine. Adults who are at high risk for pneumococcal disease should obtain the PPSV23 vaccine at least 8 weeks after the dose of PCV13 vaccine. Adults older than 34 years of age who have normal immune system function should obtain the PPSV23 vaccine dose at least 1 year after the dose of PCV13 vaccine.  Pneumococcal polysaccharide (PPSV23) vaccine. When PCV13 is also indicated, PCV13 should be obtained first. All adults aged 45 years and older should be immunized. An adult younger than age 58 years who has certain medical conditions should be immunized. Any person who resides in a nursing home or long-term care facility should be immunized. An adult smoker should be immunized. People with an immunocompromised condition and certain other conditions should receive both PCV13 and PPSV23 vaccines. People with human immunodeficiency virus (HIV) infection should be immunized as soon as possible after diagnosis. Immunization during chemotherapy or radiation therapy should be avoided. Routine use of PPSV23 vaccine is not recommended for American Indians, [place] Natives, or people younger than 65 years unless there are medical conditions that require PPSV23 vaccine. When indicated, people who have unknown immunization and have no record of immunization should receive PPSV23 vaccine. One-time revaccination 5 years after the first  dose of PPSV23 is recommended for people aged 19-64 years who have chronic kidney failure, nephrotic syndrome, asplenia, or immunocompromised conditions. People who received 1-2 doses of PPSV23 before age 12 years should receive another dose of PPSV23 vaccine at age 56 years or later if at least 5 years have passed since the previous dose. Doses of PPSV23 are not needed for people immunized with PPSV23 at or after age 66 years.  Meningococcal vaccine. Adults with asplenia or persistent  complement component deficiencies should receive 2 doses of quadrivalent meningococcal conjugate (MenACWY-D) vaccine. The doses should be obtained at least 2 months apart. Microbiologists working with certain meningococcal bacteria, [employer] recruits, people at risk during an outbreak, and people who travel to or live in countries with a high rate of meningitis should be immunized. A first-year college student up through age 75 years who is living in a residence hall should receive a dose if he did not receive a dose on or after his 16th birthday. Adults who have certain high-risk conditions should receive one or more doses of vaccine.  Hepatitis A vaccine. Adults who wish to be protected from this disease, have chronic liver disease, work with hepatitis A-infected animals, work in hepatitis A research labs, or travel to or work in countries with a high rate of hepatitis A should be immunized. Adults who were previously unvaccinated and who anticipate close contact with an international adoptee during the first 60 days after arrival in the [place] from a country with a high rate of hepatitis A should be immunized.  Hepatitis B vaccine. Adults should be immunized if they wish to be protected from this disease, are under age 49 years and have diabetes, have chronic liver disease, have had more than one sex partner in the past 6 months, may be exposed to blood or other infectious body fluids, are household contacts or sex  partners of hepatitis B positive people, are clients or workers in certain care facilities, or travel to or work in countries with a high rate of hepatitis B.  Haemophilus influenzae type b (Hib) vaccine. A previously unvaccinated person with asplenia or sickle cell disease or having a scheduled splenectomy should receive 1 dose of Hib vaccine. Regardless of previous immunization, a recipient of a hematopoietic stem cell transplant should receive a 3-dose series 6-12 months after his successful transplant. Hib vaccine is not recommended for adults with HIV infection. Preventive Service / Frequency Ages 77 to 72  Blood pressure check.** / Every 3-5 years.  Lipid and cholesterol check.** / Every 5 years beginning at age 78.  Hepatitis C blood test.** / For any individual with known risks for hepatitis C.  Skin self-exam. / Monthly.  Influenza vaccine. / Every year.  Tetanus, diphtheria, and acellular pertussis (Tdap, Td) vaccine.** / Consult your health care provider. 1 dose of Td every 10 years.  Varicella vaccine.** / Consult your health care provider.  HPV vaccine. / 3 doses over 6 months, if 7 or younger.  Measles, mumps, rubella (MMR) vaccine.** / You need at least 1 dose of MMR if you were born in 1957 or later. You may also need a second dose.  Pneumococcal 13-valent conjugate (PCV13) vaccine.** / Consult your health care provider.  Pneumococcal polysaccharide (PPSV23) vaccine.** / 1 to 2 doses if you smoke cigarettes or if you have certain conditions.  Meningococcal vaccine.** / 1 dose if you are age 76 to 25 years and a Market researcher living in a residence hall, or have one of several medical conditions. You may also need additional booster doses.  Hepatitis A vaccine.** / Consult your health care provider.  Hepatitis B vaccine.** / Consult your health care provider.  Haemophilus influenzae type b (Hib) vaccine.** / Consult your health care provider. Ages 19 to  80  Blood pressure check.** / Every year.  Lipid and cholesterol check.** / Every 5 years beginning at age 71.  Lung cancer screening. / Every year if you are aged  55-80 years and have a 30-pack-year history of smoking and currently smoke or have quit within the past 15 years. Yearly screening is stopped once you have quit smoking for at least 15 years or develop a health problem that would prevent you from having lung cancer treatment.  Fecal occult blood test (FOBT) of stool. / Every year beginning at age 37 and continuing until age 79. You may not have to do this test if you get a colonoscopy every 10 years.  Flexible sigmoidoscopy** or colonoscopy.** / Every 5 years for a flexible sigmoidoscopy or every 10 years for a colonoscopy beginning at age 61 and continuing until age 55.  Hepatitis C blood test.** / For all people born from [birth year] through 1965 and any individual with known risks for hepatitis C.  Skin self-exam. / Monthly.  Influenza vaccine. / Every year.  Tetanus, diphtheria, and acellular pertussis (Tdap/Td) vaccine.** / Consult your health care provider. 1 dose of Td every 10 years.  Varicella vaccine.** / Consult your health care provider.  Zoster vaccine.** / 1 dose for adults aged 33 years or older.  Measles, mumps, rubella (MMR) vaccine.** / You need at least 1 dose of MMR if you were born in 1957 or later. You may also need a second dose.  Pneumococcal 13-valent conjugate (PCV13) vaccine.** / Consult your health care provider.  Pneumococcal polysaccharide (PPSV23) vaccine.** / 1 to 2 doses if you smoke cigarettes or if you have certain conditions.  Meningococcal vaccine.** / Consult your health care provider.  Hepatitis A vaccine.** / Consult your health care provider.  Hepatitis B vaccine.** / Consult your health care provider.  Haemophilus influenzae type b (Hib) vaccine.** / Consult your health care provider. Ages 44 and over  Blood pressure check.** /  Every year.  Lipid and cholesterol check.**/ Every 5 years beginning at age 29.  Lung cancer screening. / Every year if you are aged 45-80 years and have a 30-pack-year history of smoking and currently smoke or have quit within the past 15 years. Yearly screening is stopped once you have quit smoking for at least 15 years or develop a health problem that would prevent you from having lung cancer treatment.  Fecal occult blood test (FOBT) of stool. / Every year beginning at age 63 and continuing until age 54. You may not have to do this test if you get a colonoscopy every 10 years.  Flexible sigmoidoscopy** or colonoscopy.** / Every 5 years for a flexible sigmoidoscopy or every 10 years for a colonoscopy beginning at age 21 and continuing until age 76.  Hepatitis C blood test.** / For all people born from [birth year] through 1965 and any individual with known risks for hepatitis C.  Abdominal aortic aneurysm (AAA) screening.** / A one-time screening for ages 53 to 60 years who are current or former smokers.  Skin self-exam. / Monthly.  Influenza vaccine. / Every year.  Tetanus, diphtheria, and acellular pertussis (Tdap/Td) vaccine.** / 1 dose of Td every 10 years.  Varicella vaccine.** / Consult your health care provider.  Zoster vaccine.** / 1 dose for adults aged 9 years or older.  Pneumococcal 13-valent conjugate (PCV13) vaccine.** / 1 dose for all adults aged 67 years and older.  Pneumococcal polysaccharide (PPSV23) vaccine.** / 1 dose for all adults aged 42 years and older.  Meningococcal vaccine.** / Consult your health care provider.  Hepatitis A vaccine.** / Consult your health care provider.  Hepatitis B vaccine.** / Consult your health care provider.  Haemophilus  influenzae type b (Hib) vaccine.** / Consult your health care provider. **Family history and personal history of risk and conditions may change your health care provider's recommendations.   This information is not  intended to replace advice given to you by your health care provider. Make sure you discuss any questions you have with your health care provider.   Document Released: 05/22/2001 Document Revised: 04/16/2014 Document Reviewed: 08/21/2010 Elsevier Interactive Patient Education [employer].

## 2016-01-26 NOTE — Assessment & Plan Note (Signed)
Depression screen negative. Health Maintenance reviewed -- Declines flu shot. Tetanus up-to-date. Preventive schedule discussed and handout given in AVS. Patient to return for fasting labs. Orders placed.

## 2016-01-30 ENCOUNTER — Emergency Department (HOSPITAL_BASED_OUTPATIENT_CLINIC_OR_DEPARTMENT_OTHER)
Admission: EM | Admit: 2016-01-30 | Discharge: 2016-01-30 | Disposition: A | Discharge status: Home / Self Care | Payer: 59 | Attending: Emergency Medicine | Admitting: Emergency Medicine

## 2016-01-30 ENCOUNTER — Encounter: Payer: Self-pay | Admitting: Medical

## 2016-01-30 ENCOUNTER — Ambulatory Visit (INDEPENDENT_AMBULATORY_CARE_PROVIDER_SITE_OTHER): Payer: 59 | Admitting: Medical

## 2016-01-30 ENCOUNTER — Encounter (HOSPITAL_BASED_OUTPATIENT_CLINIC_OR_DEPARTMENT_OTHER): Payer: Self-pay | Admitting: *Deleted

## 2016-01-30 ENCOUNTER — Emergency Department (HOSPITAL_BASED_OUTPATIENT_CLINIC_OR_DEPARTMENT_OTHER): Payer: 59

## 2016-01-30 VITALS — BP 122/80 | HR 71 | Temp 98.1°F | Ht 66.0 in | Wt 233.4 lb

## 2016-01-30 DIAGNOSIS — R52 Pain, unspecified: Secondary | ICD-10-CM

## 2016-01-30 DIAGNOSIS — N50819 Testicular pain, unspecified: Secondary | ICD-10-CM

## 2016-01-30 DIAGNOSIS — N50812 Left testicular pain: Secondary | ICD-10-CM | POA: Diagnosis not present

## 2016-01-30 LAB — URINE MICROSCOPIC-ADD ON
RBC / HPF: NONE SEEN RBC/hpf (ref 0–5)
Squamous Epithelial / LPF: NONE SEEN
WBC UA: NONE SEEN WBC/hpf (ref 0–5)

## 2016-01-30 LAB — URINALYSIS, ROUTINE W REFLEX MICROSCOPIC
Bilirubin Urine: NEGATIVE
Glucose, UA: NEGATIVE mg/dL
Hgb urine dipstick: NEGATIVE
Ketones, ur: NEGATIVE mg/dL
Leukocytes, UA: NEGATIVE
Nitrite: NEGATIVE
Protein, ur: NEGATIVE mg/dL
Specific Gravity, Urine: 1.025 (ref 1.005–1.030)
pH: 7 (ref 5.0–8.0)

## 2016-01-30 NOTE — ED Provider Notes (Signed)
[facility] Provider Note   CSN: [id number] Arrival date & time: 01/30/16  1730  By signing my name below, I, Clovis Pu, attest that this documentation has been prepared under the direction and in the presence of Doug Sou, MD  Electronically Signed: Clovis Pu, ED Scribe. 01/30/16. 6:07 PM.   History   Chief Complaint Chief Complaint  Patient presents with  . Testicle Pain     The history is provided by the patient. No language interpreter was used.   HPI Comments:  Glenn Butler is a 35 y.o. male who presents to the Emergency Department complaining of L testicular pain which began in the AM todayUpon awakening. Pt states the pain worsened at 1 PM. He states his pain is a  "8/10" when sitting or standing and a "6/10" when laying down. No alleviating factors noted. No treatment prior to coming here Denies dysuria and any other symptoms. Pt is a non-smoker and denies alcohol and drug use.   Past Medical History:  Diagnosis Date  . Environmental allergies   . Seasonal allergies     Patient Active Problem List   Diagnosis Date Noted  . Allergic rhinitis 07/14/2014  . Need for prophylactic vaccination with combined diphtheria-tetanus-pertussis (DTP) vaccine 10/20/2013  . Visit for preventive health examination 10/20/2013  . GERD (gastroesophageal reflux disease) 10/20/2013    Past Surgical History:  Procedure Laterality Date  . Unremarkable.         Home Medications    Prior to Admission medications   Medication Sig Start Date End Date Taking? Authorizing Provider  meloxicam (MOBIC) 15 MG tablet Take 1 tablet (15 mg total) by mouth daily. 12/07/15   Waldon Merl, PA-C  Multiple Vitamin (MULTIVITAMIN WITH MINERALS) TABS tablet Take 1 tablet by mouth daily. 01/25/16   Waldon Merl, PA-C  ranitidine (ZANTAC) 150 MG capsule Take 1 capsule (150 mg total) by mouth 2 (two) times daily. 01/25/16   Waldon Merl, PA-C    Family  History Family History  Problem Relation Age of Onset  . Hypertension Father     Living  . Cervical cancer Mother     Living  . Hypertension Paternal Grandmother   . Hypertension Paternal Grandfather   . Stroke Paternal Grandfather   . Healthy Brother     x3  . Healthy Sister     x4  . Healthy Daughter     x1  . Diabetes Maternal Grandmother     Social History Social History  Substance Use Topics  . Smoking status: Never Smoker  . Smokeless tobacco: Never Used  . Alcohol use No     Allergies   Review of patient's allergies indicates no known allergies.   Review of Systems Review of Systems  Constitutional: Negative.   HENT: Negative.   Respiratory: Negative.   Cardiovascular: Negative.   Gastrointestinal: Negative.   Genitourinary: Positive for testicular pain. Negative for dysuria.  Musculoskeletal: Negative.   Skin: Negative.   Neurological: Negative.   Psychiatric/Behavioral: Negative.   All other systems reviewed and are negative.    Physical Exam Updated Vital Signs BP 143/88   Pulse 69   Temp 97.9 F (36.6 C) (Oral)   Resp 20   SpO2 99%   Physical Exam  Constitutional: He appears well-developed and well-nourished.  HENT:  Head: Normocephalic and atraumatic.  Eyes: Conjunctivae are normal. Pupils are equal, round, and reactive to light.  Neck: Neck supple. No tracheal deviation present. No thyromegaly present.  Cardiovascular: Normal rate and regular rhythm.   No murmur heard. Pulmonary/Chest: Effort normal and breath sounds normal.  Abdominal: Soft. Bowel sounds are normal. He exhibits no distension. There is no tenderness.  Genitourinary: Penis normal.  Genitourinary Comments: Scrotum with normal appearance, no masses. Testes in normal lie. Left testicle minimally tender anteriorly. He has minimal at left testicle pain with walking.  Musculoskeletal: Normal range of motion. He exhibits no edema or tenderness.  Neurological: He is alert.  Coordination normal.  Skin: Skin is warm and dry. No rash noted.  Psychiatric: He has a normal mood and affect.  Nursing note and vitals reviewed.   Results for orders placed or performed during the hospital encounter of 01/30/16  Urinalysis, Routine w reflex microscopic (not at [facility])  Result Value Ref Range   Color, Urine YELLOW YELLOW   APPearance TURBID (A) CLEAR   Specific Gravity, Urine 1.025 1.005 - 1.030   pH 7.0 5.0 - 8.0   Glucose, UA NEGATIVE NEGATIVE mg/dL   Hgb urine dipstick NEGATIVE NEGATIVE   Bilirubin Urine NEGATIVE NEGATIVE   Ketones, ur NEGATIVE NEGATIVE mg/dL   Protein, ur NEGATIVE NEGATIVE mg/dL   Nitrite NEGATIVE NEGATIVE   Leukocytes, UA NEGATIVE NEGATIVE  Urine microscopic-add on  Result Value Ref Range   Squamous Epithelial / LPF NONE SEEN NONE SEEN   WBC, UA NONE SEEN 0 - 5 WBC/hpf   RBC / HPF NONE SEEN 0 - 5 RBC/hpf   Bacteria, UA FEW (A) NONE SEEN   Urine-Other AMORPHOUS URATES/PHOSPHATES    [place] Scrotum  Result Date: 01/30/2016 CLINICAL DATA:  35 year old with sudden onset of left testicular pain 8 hours ago. EXAM: SCROTAL ULTRASOUND DOPPLER ULTRASOUND OF THE TESTICLES TECHNIQUE: Complete ultrasound examination of the testicles, epididymis, and other scrotal structures was performed. Color and spectral Doppler ultrasound were also utilized to evaluate blood flow to the testicles. COMPARISON:  None. FINDINGS: Right testicle Measurements: 4.2 x 2.0 x 2.5 cm. No mass or microlithiasis visualized. Normal blood flow with color Doppler. Left testicle Measurements: 3.7 x 2.0 x 2.7 cm. No mass or microlithiasis visualized. Normal blood flow with color Doppler. Right epididymis: Tiny epididymal cyst or spermatocele, measuring 5 mm maximally. Otherwise unremarkable. Left epididymis: Mild heterogeneity within the epididymal head, corresponding with the area of pain. No associated abnormal blood flow. Hydrocele:  Small left-sided hydrocele. Varicocele:  None visualized.  Pulsed Doppler interrogation of both testes demonstrates normal low resistance arterial and venous waveforms bilaterally. IMPRESSION: 1. Both testes appear normal.  No evidence of testicular torsion. 2. Mild heterogeneity within the left epididymal head, corresponding with the area of pain. This may be inflammatory, although there is no significant hypervascularity on color Doppler. Small left-sided hydrocele. Electronically Signed   By: Carey BullocksWilliam  Veazey M.D.   On: 01/30/2016 19:15   [place] Art/ven Flow Abd Pelv Doppler  Result Date: 01/30/2016 CLINICAL DATA:  35 year old with sudden onset of left testicular pain 8 hours ago. EXAM: SCROTAL ULTRASOUND DOPPLER ULTRASOUND OF THE TESTICLES TECHNIQUE: Complete ultrasound examination of the testicles, epididymis, and other scrotal structures was performed. Color and spectral Doppler ultrasound were also utilized to evaluate blood flow to the testicles. COMPARISON:  None. FINDINGS: Right testicle Measurements: 4.2 x 2.0 x 2.5 cm. No mass or microlithiasis visualized. Normal blood flow with color Doppler. Left testicle Measurements: 3.7 x 2.0 x 2.7 cm. No mass or microlithiasis visualized. Normal blood flow with color Doppler. Right epididymis: Tiny epididymal cyst or spermatocele, measuring 5 mm maximally. Otherwise unremarkable.  Left epididymis: Mild heterogeneity within the epididymal head, corresponding with the area of pain. No associated abnormal blood flow. Hydrocele:  Small left-sided hydrocele. Varicocele:  None visualized. Pulsed Doppler interrogation of both testes demonstrates normal low resistance arterial and venous waveforms bilaterally. IMPRESSION: 1. Both testes appear normal.  No evidence of testicular torsion. 2. Mild heterogeneity within the left epididymal head, corresponding with the area of pain. This may be inflammatory, although there is no significant hypervascularity on color Doppler. Small left-sided hydrocele. Electronically Signed   By: Carey Bullocks M.D.   On: 01/30/2016 19:15   ED Treatments / Results  DIAGNOSTIC STUDIES: Declines pain medicine Oxygen Saturation is 99% on RA, normal by my interpretation.    COORDINATION OF CARE:  6:05 PM Discussed treatment plan with pt at bedside and pt agreed to plan.  Labs (all labs ordered are listed, but only abnormal results are displayed) Labs Reviewed  URINALYSIS, ROUTINE W REFLEX MICROSCOPIC (NOT AT [facility])    EKG  EKG Interpretation None       Radiology No results found.  Procedures Procedures (including critical care time)  Medications Ordered in ED Medications - No data to display   Initial Impression / Assessment and Plan / ED Course  I have reviewed the triage vital signs and the nursing notes.  Pertinent labs & imaging results that were available during my care of the patient were reviewed by me and considered in my medical decision making (see chart for details).  Clinical Course    8 PM pain continues to improve. He ambulates without difficulty. I suspect the patient may have epididymitis, mild, noninfectious etiology. No leukocytes in urine. Plan Advil for pain. Referral [facility] Dr. Isabel Caprice if significant pain one week Final Clinical Impressions(s) / ED Diagnoses  Diagnosis pain in left testicle Final diagnoses:  None    New Prescriptions New Prescriptions   No medications on file   I personally performed the services described in this documentation, which was scribed in my presence. The recorded information has been reviewed and considered.    Doug Sou, MD 01/30/16 2009

## 2016-01-30 NOTE — ED Triage Notes (Signed)
Testicular pain this am but progressively worse throughout the day. He was seen by his MD and sent here for further testing.

## 2016-01-30 NOTE — Progress Notes (Signed)
Subjective:    Patient ID: Glenn Butler, male    DOB: [birth year], 35 y.o.   MRN: [id number]  HPI   Pt in for some left testicle pain. Pain first noticed today. No history of any hernia repair. No hx of undescended testicle.  Pt is states pain pain level is about 8/10. Pt states feels like got hit in testicle. But no history consistent with.  Severe pain for about 5 hours.   Review of Systems  Constitutional: Negative for chills, fatigue and fever.  Respiratory: Negative for cough, chest tightness and shortness of breath.   Cardiovascular: Negative for chest pain and palpitations.  Gastrointestinal: Negative for abdominal pain.  Genitourinary: Positive for testicular pain. Negative for decreased urine volume, dysuria, frequency, hematuria, penile swelling and urgency.  Musculoskeletal: Negative for back pain.  Skin: Negative for pallor and rash.  Neurological: Negative for dizziness and headaches.  Hematological: Negative for adenopathy. Does not bruise/bleed easily.  Psychiatric/Behavioral: Negative for behavioral problems and confusion.    Past Medical History:  Diagnosis Date  . Environmental allergies   . Seasonal allergies      Social History   Social History  . Marital status: Married    Spouse name: N/A  . Number of children: N/A  . Years of education: N/A   Occupational History  . Not on file.   Social History Main Topics  . Smoking status: Never Smoker  . Smokeless tobacco: Never Used  . Alcohol use No  . Drug use: No  . Sexual activity: Yes   Other Topics Concern  . Not on file   Social History Narrative  . No narrative on file    Past Surgical History:  Procedure Laterality Date  . Unremarkable.      Family History  Problem Relation Age of Onset  . Hypertension Father     Living  . Cervical cancer Mother     Living  . Hypertension Paternal Grandmother   . Hypertension Paternal Grandfather   . Stroke Paternal Grandfather   .  Healthy Brother     x3  . Healthy Sister     x4  . Healthy Daughter     x1  . Diabetes Maternal Grandmother     No Known Allergies  Current Outpatient Prescriptions on File Prior to Visit  Medication Sig Dispense Refill  . meloxicam (MOBIC) 15 MG tablet Take 1 tablet (15 mg total) by mouth daily. 20 tablet 0  . Multiple Vitamin (MULTIVITAMIN WITH MINERALS) TABS tablet Take 1 tablet by mouth daily. 30 tablet 11  . ranitidine (ZANTAC) 150 MG capsule Take 1 capsule (150 mg total) by mouth 2 (two) times daily. 60 capsule 3   No current facility-administered medications on file prior to visit.     BP 122/80   Pulse 71   Temp 98.1 F (36.7 C) (Oral)   Ht 5\' 6"  (1.676 m)   Wt 233 lb 6.4 oz (105.9 kg)   SpO2 96%   BMI 37.67 kg/m       Objective:   Physical Exam   General- No acute distress. Pleasant patient. Neck- Full range of motion, no jvd Lungs- Clear, even and unlabored. Heart- regular rate and rhythm. Neurologic- CNII- XII grossly intact.  Genital exam- rt testicle not tender. Medial and upper pole of lt testicle tender. Not swollen. No masses. Pain level moderate to severe on palpation.     Assessment & Plan:  With your testicle pain and increasing level pain over  past 5 hours I do think it is best to be evaluated in the ED. I have talked with the ED physician and made him aware of your presentation. He will evaluate you and order appropiate studies which would like be Scrotal US to assess blood flow and if any twisting of testicle.  Please go down now as getting a immediate evaluation now if very important.  Follow up here after evaluation as they determine.

## 2016-01-30 NOTE — Discharge Instructions (Signed)
Take Advil as directed for pain. Call Dr.Grapey's office to schedule appointment if having significant pain in 3 or 4 days. Return if condition worsens for any reason.

## 2016-01-30 NOTE — Patient Instructions (Addendum)
With your testicle pain and increasing level pain over past 5 hours I do think it is best to be evaluated in the ED. I have talked with the ED physician and made him aware of your presentation. He will evaluate you and order appropiate studies which would like be Scrotal US to assess blood flow and if any twisting of testicle.  Please go down now as getting a immediate evaluation now if very important.  Follow up here after evaluation as they determine.

## 2016-01-30 NOTE — Progress Notes (Signed)
Pre visit review using our clinic review tool, if applicable. No additional management support is needed unless otherwise documented below in the visit note. 

## 2016-01-31 ENCOUNTER — Other Ambulatory Visit: Payer: 59

## 2016-09-21 ENCOUNTER — Telehealth: Payer: Self-pay | Admitting: Physician Assistant

## 2016-09-21 NOTE — Telephone Encounter (Signed)
Pt would like a call back from you sometime today, when I asked what this was regarding to, he stated he just wanted a call back.

## 2016-09-21 NOTE — Telephone Encounter (Signed)
Pt called back a 2nd time asking to speak with Selena BattenCody, again not saying what this regarding to.

## 2016-09-21 NOTE — Telephone Encounter (Signed)
Spoke with patient. Wife had PAP + for HPV. Wants examination and to discuss HPV with himself and wife as he states she is concerned with infidelity. He states he has never had issue before and has not been faithful. Discussed HPV in brief with him and that it is very common and in no way implies infidelity as either he or wife could have had since younger days and been unaware. Appt scheduled for Monday to discuss in more detail.

## 2016-09-24 ENCOUNTER — Other Ambulatory Visit: Payer: Self-pay | Admitting: *Deleted

## 2016-09-24 ENCOUNTER — Ambulatory Visit (INDEPENDENT_AMBULATORY_CARE_PROVIDER_SITE_OTHER): Payer: 59 | Admitting: Physician Assistant

## 2016-09-24 ENCOUNTER — Encounter: Payer: Self-pay | Admitting: Physician Assistant

## 2016-09-24 VITALS — BP 120/82 | HR 78 | Temp 98.4°F | Resp 14 | Ht 66.0 in | Wt 233.0 lb

## 2016-09-24 DIAGNOSIS — B977 Papillomavirus as the cause of diseases classified elsewhere: Secondary | ICD-10-CM

## 2016-09-24 DIAGNOSIS — Z20828 Contact with and (suspected) exposure to other viral communicable diseases: Secondary | ICD-10-CM

## 2016-09-24 DIAGNOSIS — L989 Disorder of the skin and subcutaneous tissue, unspecified: Secondary | ICD-10-CM

## 2016-09-24 DIAGNOSIS — Z0000 Encounter for general adult medical examination without abnormal findings: Secondary | ICD-10-CM

## 2016-09-24 DIAGNOSIS — Z1151 Encounter for screening for human papillomavirus (HPV): Secondary | ICD-10-CM | POA: Diagnosis not present

## 2016-09-24 DIAGNOSIS — Z299 Encounter for prophylactic measures, unspecified: Secondary | ICD-10-CM | POA: Diagnosis not present

## 2016-09-24 LAB — LIPID PANEL
Cholesterol: 222 mg/dL — ABNORMAL HIGH (ref 0–200)
HDL: 38.8 mg/dL — ABNORMAL LOW (ref >39.00)
LDL Cholesterol: 161 mg/dL — ABNORMAL HIGH (ref 0–99)
NonHDL: 183.42
Total CHOL/HDL Ratio: 6
Triglycerides: 113 mg/dL (ref 0.0–149.0)
VLDL: 22.6 mg/dL (ref 0.0–40.0)

## 2016-09-24 LAB — COMPREHENSIVE METABOLIC PANEL
ALT: 33 U/L (ref 0–53)
AST: 20 U/L (ref 0–37)
Albumin: 4.3 g/dL (ref 3.5–5.2)
Alkaline Phosphatase: 68 U/L (ref 39–117)
BUN: 9 mg/dL (ref 6–23)
CHLORIDE: 102 meq/L (ref 96–112)
CO2: 28 mEq/L (ref 19–32)
CREATININE: 0.73 mg/dL (ref 0.40–1.50)
Calcium: 9.6 mg/dL (ref 8.4–10.5)
GFR: 157.08 mL/min (ref >60.00)
GLUCOSE: 95 mg/dL (ref 70–99)
Potassium: 4.4 mEq/L (ref 3.5–5.1)
SODIUM: 136 meq/L (ref 135–145)
TOTAL PROTEIN: 7.5 g/dL (ref 6.0–8.3)
Total Bilirubin: 0.3 mg/dL (ref 0.2–1.2)

## 2016-09-24 LAB — HEMOGLOBIN A1C: Hgb A1c MFr Bld: 5.5 % (ref 4.6–6.5)

## 2016-09-24 LAB — CBC
HCT: 43.7 % (ref 39.0–52.0)
Hemoglobin: 14.3 g/dL (ref 13.0–17.0)
MCHC: 32.7 g/dL (ref 30.0–36.0)
MCV: 89.4 fl (ref 78.0–100.0)
Platelets: 265 10*3/uL (ref 150.0–400.0)
RBC: 4.88 Mil/uL (ref 4.22–5.81)
RDW: 14.1 % (ref 11.5–15.5)
WBC: 5 10*3/uL (ref 4.0–10.5)

## 2016-09-24 LAB — TSH: TSH: 2.32 u[IU]/mL (ref 0.35–4.50)

## 2016-09-24 NOTE — Progress Notes (Signed)
Patient presents to clinic today for follow-up of phone conversation on Friday. Wife with routine PAP smear last week with normal cytology but positive HPV. This is a first for her and as such there was concern for infidelity. Patient has denied any extramarital activity. He and his wife have been together for 10 years. Had previously discussed HPV epidemiology and how it is commonplace and oftentimes asymptomatic. Reassurance given at that time. Patient did not some penile skin changes. Patient has noted over the past 6 months -- raised skin on dorsal penile shaft. No pain, itch. No change in skin coloration.  Denies penile pain and discharge. Denies concern for other STI.  Past Medical History:  Diagnosis Date  . Environmental allergies   . Seasonal allergies     Current Outpatient Prescriptions on File Prior to Visit  Medication Sig Dispense Refill  . Multiple Vitamin (MULTIVITAMIN WITH MINERALS) TABS tablet Take 1 tablet by mouth daily. 30 tablet 11   No current facility-administered medications on file prior to visit.     No Known Allergies  Family History  Problem Relation Age of Onset  . Hypertension Father        Living  . Cervical cancer Mother        Living  . Hypertension Paternal Grandmother   . Hypertension Paternal Grandfather   . Stroke Paternal Grandfather   . Healthy Brother        x3  . Healthy Sister        x4  . Healthy Daughter        x1  . Diabetes Maternal Grandmother     Social History   Social History  . Marital status: Married    Spouse name: N/A  . Number of children: N/A  . Years of education: N/A   Social History Main Topics  . Smoking status: Never Smoker  . Smokeless tobacco: Never Used  . Alcohol use No  . Drug use: No  . Sexual activity: Yes   Other Topics Concern  . None   Social History Narrative  . None   Review of Systems - See HPI.  All other ROS are negative.  BP 120/82   Pulse 78   Temp 98.4 F (36.9 C) (Oral)    Resp 14   Ht '5\' 6"'$  (1.676 m)   Wt 233 lb (105.7 kg)   SpO2 98%   BMI 37.61 kg/m   Physical Exam  Constitutional: He is oriented to person, place, and time and well-developed, well-nourished, and in no distress.  HENT:  Head: Normocephalic and atraumatic.  Eyes: Conjunctivae are normal.  Cardiovascular: Normal rate, regular rhythm, normal heart sounds and intact distal pulses.   Pulmonary/Chest: Effort normal.  Genitourinary:  Genitourinary Comments: Tyson's glands noted of verntral penile head. There is an ara of raised tissue with verrucous appearance. The area of concern on dorsal penis is consistent with fordyce spots.  Neurological: He is alert and oriented to person, place, and time.  Skin: Skin is warm and dry.  Psychiatric: Affect normal.  Vitals reviewed.  Assessment/Plan: 1. Preventive measure Labs released from CPE as patient did not have drawn as directed. 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 Referral to Derm placed for dermatoscopy of atypical lesion. Discussed HPV is commonplace and not sign of fidelity. No way to know if he or his wife had HPV first.    Leeanne Rio,  PA-C

## 2016-09-24 NOTE — Patient Instructions (Signed)
You will be contacted by Dermatology for further assessment of the area noted on exam -- just to assess further for very small wart. The areas you note seems consistent with fordyce spots and tyson's glands, which are not worrisome. Again most sexually active adults will become infected with HPV at some point in their life. Is not a sign of infidelity, likely something one or both of you already had going in to relationship.

## 2016-09-24 NOTE — Progress Notes (Signed)
Pre visit review using our clinic review tool, if applicable. No additional management support is needed unless otherwise documented below in the visit note. 

## 2016-09-26 ENCOUNTER — Encounter: Payer: Self-pay | Admitting: Physician Assistant

## 2017-05-15 IMAGING — DX DG CHEST 2V
2 series · 2 of 2 positions shown · non-contrast
Comparison: None.

CLINICAL DATA: Chest pain and shortness of breath tonight.

EXAM:
CHEST  2 VIEW

[w chest pa]
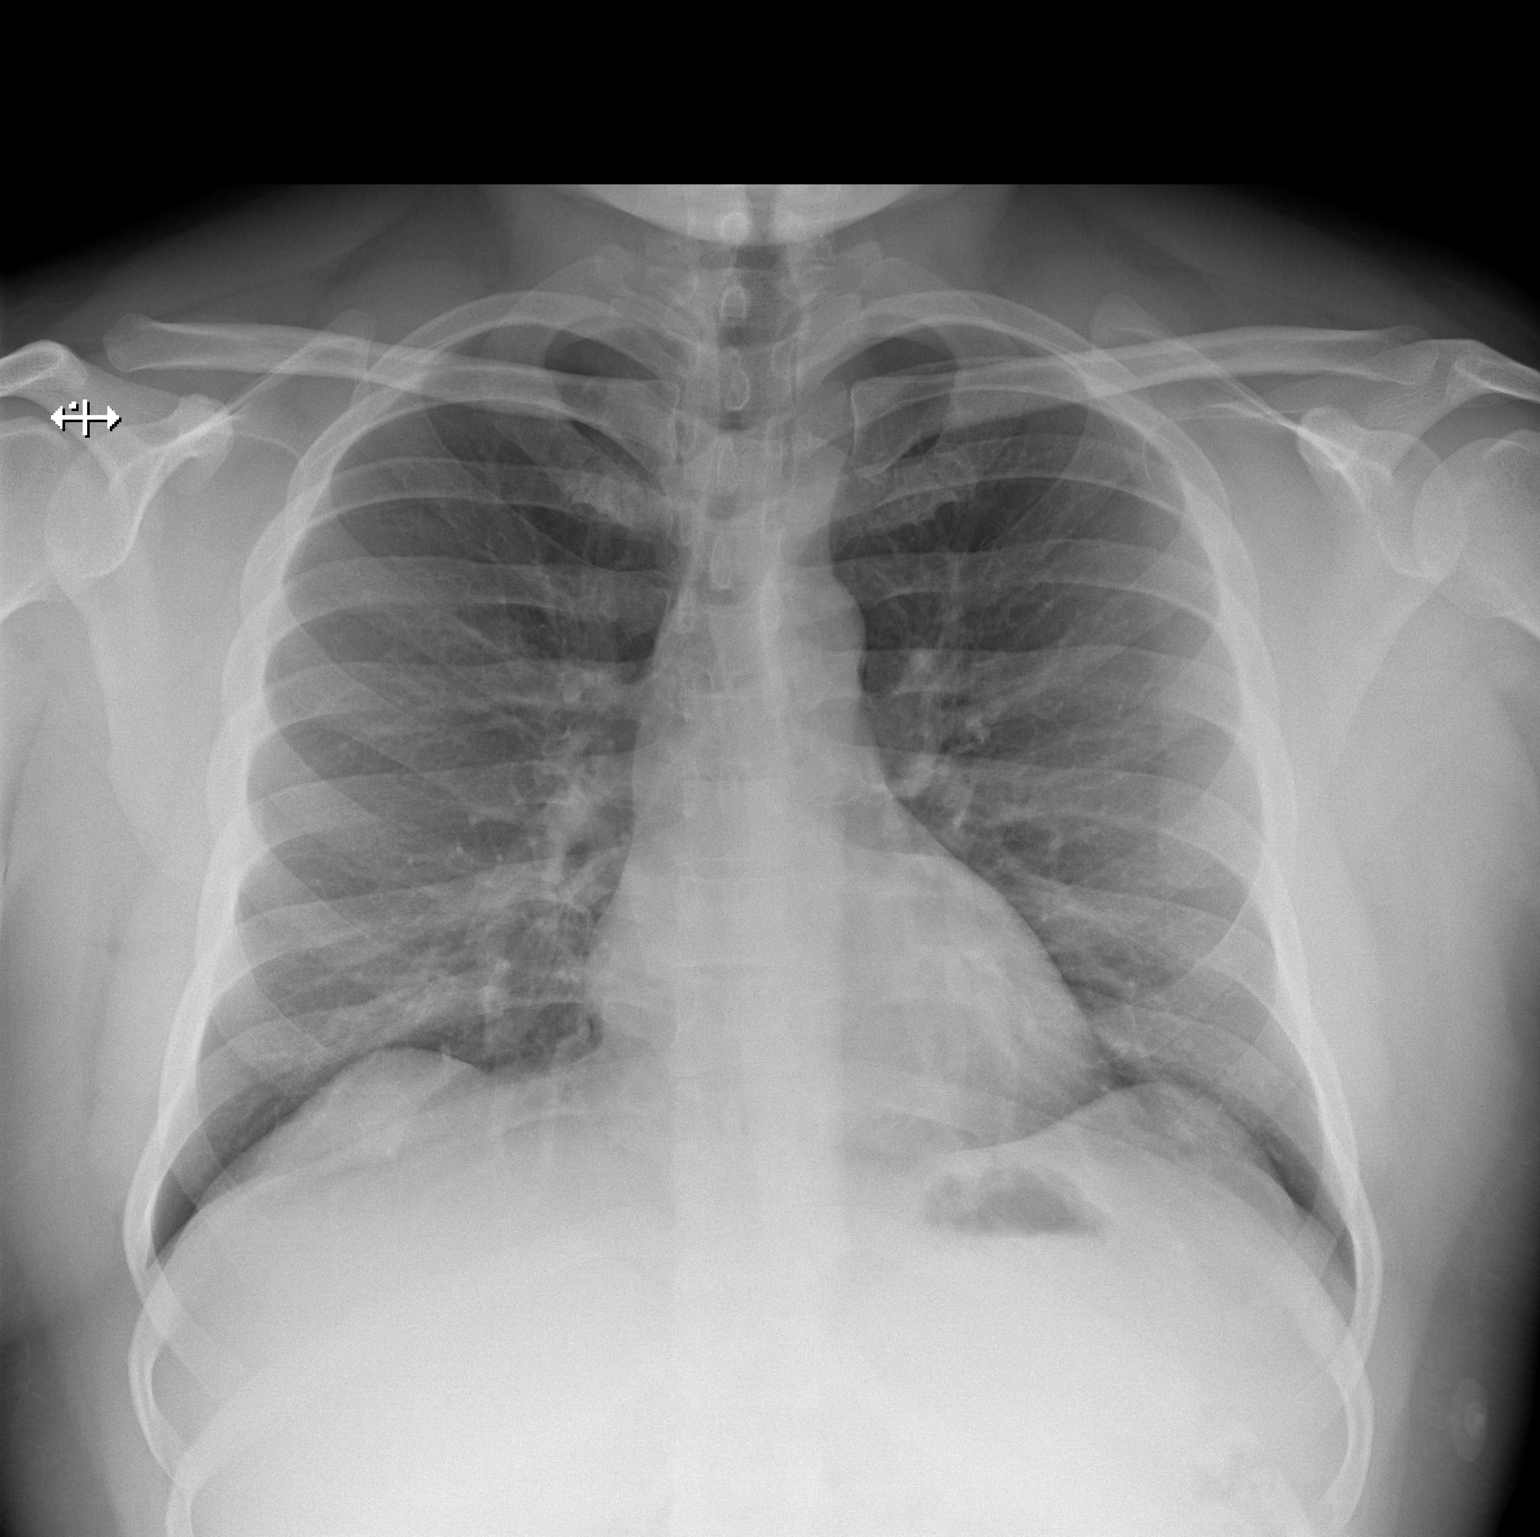

[w chest lat]
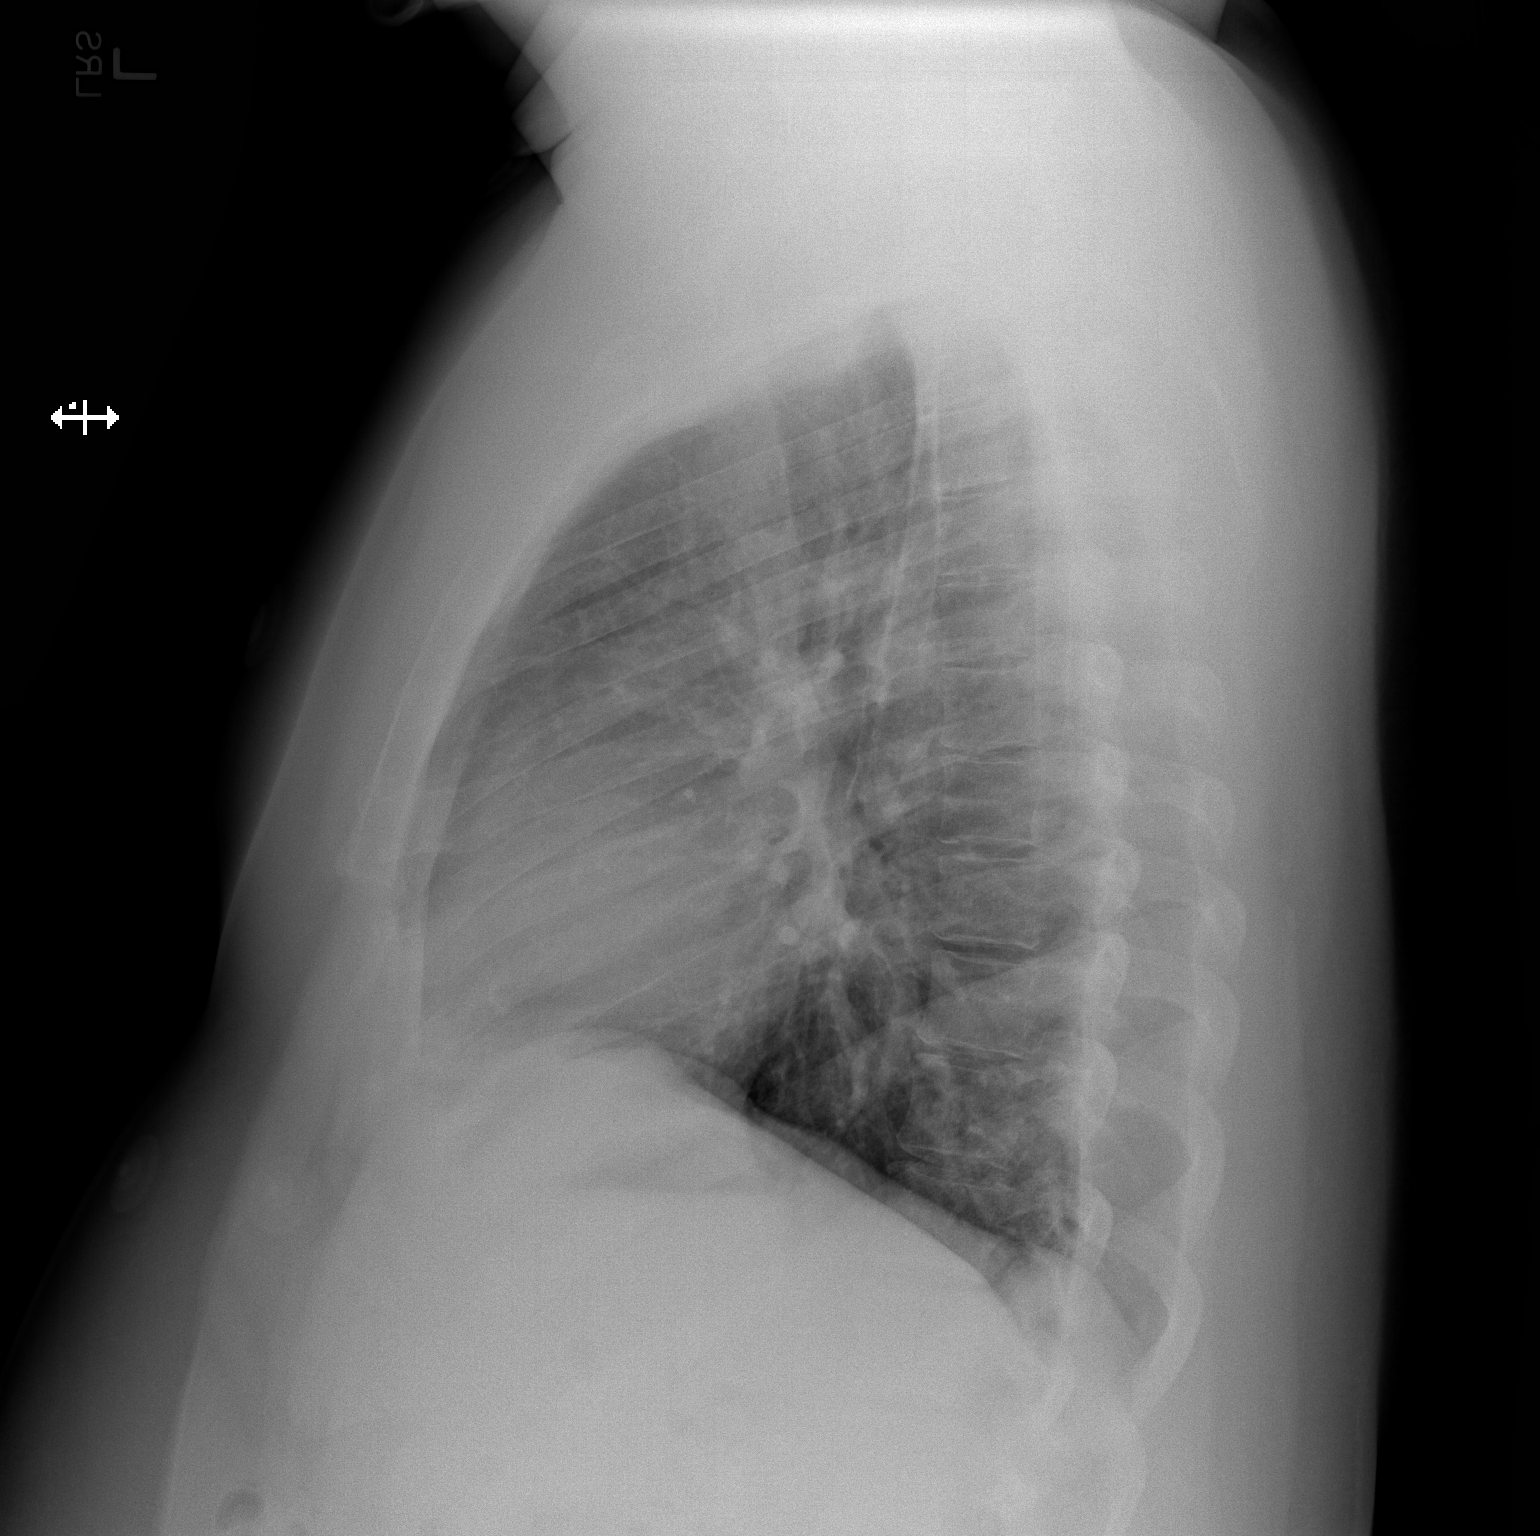

[2 of 2 positions shown; findings below may reference images not displayed]

FINDINGS: The cardiomediastinal contours are normal. The lungs are clear.
Pulmonary vasculature is normal. No consolidation, pleural effusion,
or pneumothorax. No acute osseous abnormalities are seen.
IMPRESSION: No acute pulmonary process.

## 2017-08-02 DIAGNOSIS — Z23 Encounter for immunization: Secondary | ICD-10-CM | POA: Diagnosis not present

## 2017-09-17 ENCOUNTER — Encounter: Payer: Self-pay | Admitting: Physician Assistant

## 2017-09-18 ENCOUNTER — Ambulatory Visit: Payer: Self-pay | Admitting: Physician Assistant

## 2017-09-18 DIAGNOSIS — Z0289 Encounter for other administrative examinations: Secondary | ICD-10-CM

## 2017-09-22 ENCOUNTER — Encounter: Payer: Self-pay | Admitting: Physician Assistant

## 2017-09-24 ENCOUNTER — Ambulatory Visit (INDEPENDENT_AMBULATORY_CARE_PROVIDER_SITE_OTHER): Payer: BLUE CROSS/BLUE SHIELD | Admitting: Physician Assistant

## 2017-09-24 ENCOUNTER — Other Ambulatory Visit: Payer: Self-pay

## 2017-09-24 ENCOUNTER — Encounter: Payer: Self-pay | Admitting: Physician Assistant

## 2017-09-24 VITALS — BP 124/90 | HR 77 | Temp 97.8°F | Resp 16 | Ht 66.0 in | Wt 241.0 lb

## 2017-09-24 DIAGNOSIS — M722 Plantar fascial fibromatosis: Secondary | ICD-10-CM | POA: Diagnosis not present

## 2017-09-24 DIAGNOSIS — E6609 Other obesity due to excess calories: Secondary | ICD-10-CM | POA: Diagnosis not present

## 2017-09-24 DIAGNOSIS — Z0000 Encounter for general adult medical examination without abnormal findings: Secondary | ICD-10-CM | POA: Diagnosis not present

## 2017-09-24 LAB — COMPREHENSIVE METABOLIC PANEL
ALBUMIN: 4.3 g/dL (ref 3.5–5.2)
ALT: 47 U/L (ref 0–53)
AST: 25 U/L (ref 0–37)
Alkaline Phosphatase: 81 U/L (ref 39–117)
BUN: 9 mg/dL (ref 6–23)
CALCIUM: 9.5 mg/dL (ref 8.4–10.5)
CHLORIDE: 102 meq/L (ref 96–112)
CO2: 27 mEq/L (ref 19–32)
CREATININE: 0.79 mg/dL (ref 0.40–1.50)
GFR: 142.58 mL/min (ref >60.00)
Glucose, Bld: 92 mg/dL (ref 70–99)
Potassium: 4.3 mEq/L (ref 3.5–5.1)
Sodium: 137 mEq/L (ref 135–145)
Total Bilirubin: 0.4 mg/dL (ref 0.2–1.2)
Total Protein: 7.7 g/dL (ref 6.0–8.3)

## 2017-09-24 LAB — CBC WITH DIFFERENTIAL/PLATELET
BASOS PCT: 0.3 % (ref 0.0–3.0)
Basophils Absolute: 0 10*3/uL (ref 0.0–0.1)
EOS ABS: 0 10*3/uL (ref 0.0–0.7)
Eosinophils Relative: 0.6 % (ref 0.0–5.0)
HEMATOCRIT: 43.9 % (ref 39.0–52.0)
Hemoglobin: 14.6 g/dL (ref 13.0–17.0)
LYMPHS PCT: 39.1 % (ref 12.0–46.0)
Lymphs Abs: 1.8 10*3/uL (ref 0.7–4.0)
MCHC: 33.2 g/dL (ref 30.0–36.0)
MCV: 88.1 fl (ref 78.0–100.0)
MONOS PCT: 9.6 % (ref 3.0–12.0)
Monocytes Absolute: 0.5 10*3/uL (ref 0.1–1.0)
Neutro Abs: 2.4 10*3/uL (ref 1.4–7.7)
Neutrophils Relative %: 50.4 % (ref 43.0–77.0)
Platelets: 264 10*3/uL (ref 150.0–400.0)
RBC: 4.98 Mil/uL (ref 4.22–5.81)
RDW: 13.9 % (ref 11.5–15.5)
WBC: 4.7 10*3/uL (ref 4.0–10.5)

## 2017-09-24 LAB — LIPID PANEL
CHOLESTEROL: 256 mg/dL — AB (ref 0–200)
HDL: 34.9 mg/dL — ABNORMAL LOW (ref >39.00)
LDL CALC: 197 mg/dL — AB (ref 0–99)
NonHDL: 221.25
TRIGLYCERIDES: 120 mg/dL (ref 0.0–149.0)
Total CHOL/HDL Ratio: 7
VLDL: 24 mg/dL (ref 0.0–40.0)

## 2017-09-24 LAB — HEMOGLOBIN A1C: Hgb A1c MFr Bld: 5.5 % (ref 4.6–6.5)

## 2017-09-24 NOTE — Progress Notes (Signed)
Patient presents to clinic today for annual exam.  Patient is fasting for labs.  Diet -- Poor diet overall. Eating more fast food due to work schedule (works 2nd or 3rd shift). Is starting to meal prep to avoid having to get fattening foods.  Exercise -- Walks a few miles every night at work. No other exercise regimen.   Acute Concerns: Patient endorses 2 years of pain in the L foot. Has history of ankle sprain and plantar fasciitis. Notes the pain is in his posterior mid foot and heel. Is worse with first steps in the morning and after episodes of prolonged immobilization. Notes pain is better with wearing tennis shoes than the steel-toed boots he has to wear for work. Has been on medication and supportive measures previously with no improvement.   Chronic Issues: Allergic Rhinitis -- Notes these have been very well-controlled over the last year. Is taking OTC antihistamine on a PRN basis.   Health Maintenance: Immunizations -- up-to-date  Past Medical History:  Diagnosis Date  . Allergy   . Environmental allergies   . Seasonal allergies     Past Surgical History:  Procedure Laterality Date  . Unremarkable.      No current outpatient medications on file prior to visit.   No current facility-administered medications on file prior to visit.     No Known Allergies  Family History  Problem Relation Age of Onset  . Hypertension Father        Living  . Cervical cancer Mother        Living  . Hypertension Paternal Grandmother   . Hypertension Paternal Grandfather   . Stroke Paternal Grandfather   . Healthy Brother        x3  . Healthy Sister        x4  . Healthy Daughter        x1  . Diabetes Maternal Grandmother     Social History   Socioeconomic History  . Marital status: Married    Spouse name: Not on file  . Number of children: Not on file  . Years of education: Not on file  . Highest education level: Not on file  Occupational History  . Not on file    Social Needs  . Financial resource strain: Not on file  . Food insecurity:    Worry: Not on file    Inability: Not on file  . Transportation needs:    Medical: Not on file    Non-medical: Not on file  Tobacco Use  . Smoking status: Never Smoker  . Smokeless tobacco: Never Used  Substance and Sexual Activity  . Alcohol use: No    Alcohol/week: 0.0 oz  . Drug use: No  . Sexual activity: Yes  Lifestyle  . Physical activity:    Days per week: Not on file    Minutes per session: Not on file  . Stress: Not on file  Relationships  . Social connections:    Talks on phone: Not on file    Gets together: Not on file    Attends religious service: Not on file    Active member of club or organization: Not on file    Attends meetings of clubs or organizations: Not on file    Relationship status: Not on file  . Intimate partner violence:    Fear of current or ex partner: Not on file    Emotionally abused: Not on file    Physically abused: Not on  file    Forced sexual activity: Not on file  Other Topics Concern  . Not on file  Social History Narrative  . Not on file   Review of Systems  Constitutional: Negative for fever and weight loss.  HENT: Negative for ear discharge, ear pain, hearing loss and tinnitus.   Eyes: Negative for blurred vision, double vision, photophobia and pain.  Respiratory: Negative for cough and shortness of breath.   Cardiovascular: Negative for chest pain and palpitations.  Gastrointestinal: Negative for abdominal pain, blood in stool, constipation, diarrhea, heartburn, melena, nausea and vomiting.  Genitourinary: Negative for dysuria, flank pain, frequency, hematuria and urgency.  Musculoskeletal: Positive for joint pain. Negative for falls.  Neurological: Negative for dizziness, loss of consciousness and headaches.  Endo/Heme/Allergies: Negative for environmental allergies.  Psychiatric/Behavioral: Negative for depression, hallucinations, substance abuse  and suicidal ideas. The patient is not nervous/anxious and does not have insomnia.    BP 124/90   Pulse 77   Temp 97.8 F (36.6 C) (Oral)   Resp 16   Ht 5\' 6"  (1.676 m)   Wt 241 lb (109.3 kg)   SpO2 98%   BMI 38.90 kg/m   Physical Exam  Constitutional: He is oriented to person, place, and time. He appears well-developed and well-nourished. No distress.  HENT:  Head: Normocephalic and atraumatic.  Right Ear: Tympanic membrane, external ear and ear canal normal.  Left Ear: Tympanic membrane, external ear and ear canal normal.  Nose: Nose normal.  Mouth/Throat: Oropharynx is clear and moist and mucous membranes are normal. No posterior oropharyngeal edema or posterior oropharyngeal erythema.  Eyes: Pupils are equal, round, and reactive to light. Conjunctivae are normal.  Neck: Neck supple. No thyromegaly present.  Cardiovascular: Normal rate, regular rhythm, normal heart sounds and intact distal pulses.  Pulmonary/Chest: Effort normal and breath sounds normal. No respiratory distress. He has no wheezes. He has no rales. He exhibits no tenderness.  Abdominal: Soft. Bowel sounds are normal. He exhibits no distension and no mass. There is no tenderness. There is no rebound and no guarding.  Lymphadenopathy:    He has no cervical adenopathy.  Neurological: He is alert and oriented to person, place, and time. No cranial nerve deficit.  Skin: Skin is warm and dry. No rash noted. He is not diaphoretic.  Psychiatric: He has a normal mood and affect.  Vitals reviewed.  Assessment/Plan: Visit for preventive health examination Depression screen negative. Health Maintenance reviewed. Preventive schedule discussed and handout given in AVS. Will obtain fasting labs today.   Class 2 obesity due to excess calories without serious comorbidity in adult Body mass index is 38.9 kg/m. Discussed proper diet and exercise. Patient is going to work on meal planning.   Plantar fasciitis of left  foot Recurrent symptoms. Have not responded to more conservative measures. Referral to Podiatry placed.    Piedad ClimesWilliam Cody Larna Capelle, PA-C

## 2017-09-24 NOTE — Patient Instructions (Signed)
Please go to the lab for blood work.   Our office will call you with your results unless you have chosen to receive results via MyChart.  If your blood work is normal we will follow-up each year for physicals and as scheduled for chronic medical problems.  If anything is abnormal we will treat accordingly and get you in for a follow-up.  You will be contacted by Podiatry for further assessment of your recurrent plantar fasciitis.  Get some over the counter Lamisil cream to apply to feet twice daily for 10-14 days. Keep feet moisturized. Let me know if symptoms are not resolving.     Preventive Care 18-39 Years, Male Preventive care refers to lifestyle choices and visits with your health care provider that can promote health and wellness. What does preventive care include?  A yearly physical exam. This is also called an annual well check.  Dental exams once or twice a year.  Routine eye exams. Ask your health care provider how often you should have your eyes checked.  Personal lifestyle choices, including: ? Daily care of your teeth and gums. ? Regular physical activity. ? Eating a healthy diet. ? Avoiding tobacco and drug use. ? Limiting alcohol use. ? Practicing safe sex. What happens during an annual well check? The services and screenings done by your health care provider during your annual well check will depend on your age, overall health, lifestyle risk factors, and family history of disease. Counseling Your health care provider may ask you questions about your:  Alcohol use.  Tobacco use.  Drug use.  Emotional well-being.  Home and relationship well-being.  Sexual activity.  Eating habits.  Work and work Statistician.  Screening You may have the following tests or measurements:  Height, weight, and BMI.  Blood pressure.  Lipid and cholesterol levels. These may be checked every 5 years starting at age 31.  Diabetes screening. This is done by checking  your blood sugar (glucose) after you have not eaten for a while (fasting).  Skin check.  Hepatitis C blood test.  Hepatitis B blood test.  Sexually transmitted disease (STD) testing.  Discuss your test results, treatment options, and if necessary, the need for more tests with your health care provider. Vaccines Your health care provider may recommend certain vaccines, such as:  Influenza vaccine. This is recommended every year.  Tetanus, diphtheria, and acellular pertussis (Tdap, Td) vaccine. You may need a Td booster every 10 years.  Varicella vaccine. You may need this if you have not been vaccinated.  HPV vaccine. If you are 9 or younger, you may need three doses over 6 months.  Measles, mumps, and rubella (MMR) vaccine. You may need at least one dose of MMR.You may also need a second dose.  Pneumococcal 13-valent conjugate (PCV13) vaccine. You may need this if you have certain conditions and have not been vaccinated.  Pneumococcal polysaccharide (PPSV23) vaccine. You may need one or two doses if you smoke cigarettes or if you have certain conditions.  Meningococcal vaccine. One dose is recommended if you are age 91-21 years and a first-year college student living in a residence hall, or if you have one of several medical conditions. You may also need additional booster doses.  Hepatitis A vaccine. You may need this if you have certain conditions or if you travel or work in places where you may be exposed to hepatitis A.  Hepatitis B vaccine. You may need this if you have certain conditions or if you  travel or work in places where you may be exposed to hepatitis B.  Haemophilus influenzae type b (Hib) vaccine. You may need this if you have certain risk factors.  Talk to your health care provider about which screenings and vaccines you need and how often you need them. This information is not intended to replace advice given to you by your health care provider. Make sure you  discuss any questions you have with your health care provider. Document Released: 05/22/2001 Document Revised: 12/14/2015 Document Reviewed: 01/25/2015 Elsevier Interactive Patient Education  [employer].

## 2017-09-24 NOTE — Assessment & Plan Note (Signed)
Body mass index is 38.9 kg/m. Discussed proper diet and exercise. Patient is going to work on meal planning.

## 2017-09-24 NOTE — Assessment & Plan Note (Signed)
Depression screen negative. Health Maintenance reviewed. Preventive schedule discussed and handout given in AVS. Will obtain fasting labs today.  

## 2017-09-24 NOTE — Assessment & Plan Note (Signed)
Recurrent symptoms. Have not responded to more conservative measures. Referral to Podiatry placed.

## 2017-10-15 ENCOUNTER — Ambulatory Visit (INDEPENDENT_AMBULATORY_CARE_PROVIDER_SITE_OTHER): Payer: BLUE CROSS/BLUE SHIELD | Admitting: Podiatry

## 2017-10-15 ENCOUNTER — Ambulatory Visit (INDEPENDENT_AMBULATORY_CARE_PROVIDER_SITE_OTHER): Payer: BLUE CROSS/BLUE SHIELD

## 2017-10-15 DIAGNOSIS — M779 Enthesopathy, unspecified: Secondary | ICD-10-CM | POA: Diagnosis not present

## 2017-10-15 DIAGNOSIS — M7732 Calcaneal spur, left foot: Secondary | ICD-10-CM

## 2017-10-15 DIAGNOSIS — M722 Plantar fascial fibromatosis: Secondary | ICD-10-CM | POA: Diagnosis not present

## 2017-10-15 DIAGNOSIS — M778 Other enthesopathies, not elsewhere classified: Secondary | ICD-10-CM

## 2017-10-15 MED ORDER — MELOXICAM 15 MG PO TABS: 15.0000 mg | ORAL_TABLET | Freq: Every day | ORAL | 2 refills | Status: DC

## 2017-10-15 MED ORDER — TRIAMCINOLONE ACETONIDE 10 MG/ML IJ SUSP
10.0000 mg | Freq: Once | INTRAMUSCULAR | Status: AC
  Administered 2017-10-15: 10 mg

## 2017-10-15 NOTE — Patient Instructions (Signed)

## 2017-10-16 NOTE — Progress Notes (Signed)
Subjective:   Patient ID: Glenn Butler, male   DOB: 37 y.o.   MRN: [id number]   HPI 37 year old male presents the office today for concerns of left heel pain which is been ongoing for about 2 years.  He states the working still toe boots and stands on concrete all day.  He said no significant treatment.  No recent injury.  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  He denies any swelling or redness and the pain does not wake him up at night.  No other concerns.   Review of Systems  All other systems reviewed and are negative.  Past Medical History:  Diagnosis Date  . Allergy   . Environmental allergies   . Seasonal allergies     Past Surgical History:  Procedure Laterality Date  . Unremarkable.       Current Outpatient Medications:  .  meloxicam (MOBIC) 15 MG tablet, Take 1 tablet (15 mg total) by mouth daily., Disp: 30 tablet, Rfl: 2  No Known Allergies        Objective:  Physical Exam  General: AAO x3, NAD  Dermatological: Skin is warm, dry and supple bilateral. Nails x 10 are well manicured; remaining integument appears unremarkable at this time. There are no open sores, no preulcerative lesions, no rash or signs of infection present.  Vascular: Dorsalis Pedis artery and Posterior Tibial artery pedal pulses are 2/4 bilateral with immedate capillary fill time. There is no pain with calf compression, swelling, warmth, erythema.   Neruologic: Grossly intact via light touch bilateral. Protective threshold with Semmes Wienstein monofilament intact to all pedal sites bilateral.  Negative Tinel sign.  Musculoskeletal: Tenderness to palpation along the plantar medial tubercle of the calcaneus at the insertion of plantar fascia on the left foot. There is no pain along the course of the plantar fascia within the arch of the foot. Plantar fascia appears to be intact. There is no pain with lateral compression of the calcaneus or pain with vibratory sensation.  There is no pain along the course or insertion of the achilles tendon. No other areas of tenderness to bilateral lower extremities. Muscular strength 5/5 in all groups tested bilateral.  Gait: Unassisted, Nonantalgic.       Assessment:   Left heel pain, plantar fasciitis    Plan:  -Treatment options discussed including all alternatives, risks, and complications -Etiology of symptoms were discussed -X-rays were obtained and reviewed with the patient.  There is no evidence of acute fracture or stress fracture identified today. Heel spur.  -Steroid injection was performed.  See procedure note below. -Prescribed mobic. Discussed side effects of the medication and directed to stop if any are to occur and call the office.  -Plantar fascial brace dispensed. -Discussed shoe modifications and orthotics.  Check orthotic coverage. -Discussed stretching, icing exercises daily.  Procedure: Injection Tendon/Ligament Discussed alternatives, risks, complications and verbal consent was obtained.  Location: Left plantar fascia at the glabrous junction; medial approach. Skin Prep: Alcohol. Injectate: 0.5cc 0.5% marcaine plain, 0.5 cc 2% lidocaine plain and, 1 cc kenalog 10. Disposition: Patient tolerated procedure well. Injection site dressed with a band-aid.  Post-injection care was discussed and return precautions discussed.   Return in about 3 weeks (around 11/05/2017).  Vivi BarrackMatthew R Wagoner DPM

## 2017-10-22 ENCOUNTER — Telehealth: Payer: Self-pay | Admitting: Podiatry

## 2017-10-22 NOTE — Telephone Encounter (Signed)
Left message for pt to call to discuss orthotic coverage. 

## 2017-11-07 ENCOUNTER — Encounter: Payer: Self-pay | Admitting: Podiatry

## 2017-11-07 ENCOUNTER — Ambulatory Visit (INDEPENDENT_AMBULATORY_CARE_PROVIDER_SITE_OTHER): Payer: BLUE CROSS/BLUE SHIELD | Admitting: Podiatry

## 2017-11-07 DIAGNOSIS — M722 Plantar fascial fibromatosis: Secondary | ICD-10-CM

## 2017-11-07 MED ORDER — TRIAMCINOLONE ACETONIDE 10 MG/ML IJ SUSP
10.0000 mg | Freq: Once | INTRAMUSCULAR | Status: AC
  Administered 2017-11-07: 10 mg

## 2017-11-07 NOTE — Patient Instructions (Signed)
Look at getting a "night splint"   

## 2017-11-11 NOTE — Progress Notes (Signed)
Subjective: 37 year old male presents the office today for follow-up evaluation of left heel pain.  He has not been taking the meloxicam.  The injection did help.  The brace is also been helpful.  Has been trying to do stretching icing at home as well.  Overall he is doing better but still has some mild discomfort.  He has no other concerns. Denies any systemic complaints such as fevers, chills, nausea, vomiting. No acute changes since last appointment, and no other complaints at this time.   Objective: AAO x3, NAD DP/PT pulses palpable bilaterally, CRT less than 3 seconds There is continuation with improved discomfort in the plantar medial tubercle of the calcaneus and insertion of the plantar fashion the left foot.  Plantar fascia appears to be intact.  Achilles tendon intact.  Discomfort in the Achilles tendon.  No pain with lateral compression of the calcaneus.  No other areas of tenderness. No pain with calf compression, swelling, warmth, erythema  Assessment: Left heel pain, plantar fasciitis  Plan: -All treatment options discussed with the patient including all alternatives, risks, complications.  -Second steroid injection was performed today.  See procedure note below.  Continue stretching, icing exercises daily.  He will get purchase in the night splint as well. -Patient encouraged to call the office with any questions, concerns, change in symptoms.   Procedure: Injection Tendon/Ligament Discussed alternatives, risks, complications and verbal consent was obtained.  Location: Left plantar fascia at the glabrous junction; medial approach. Skin Prep: Alcohol. Injectate: 0.5cc 0.5% marcaine plain, 0.5 cc 2% lidocaine plain and, 1 cc kenalog 10. Disposition: Patient tolerated procedure well. Injection site dressed with a band-aid.  Post-injection care was discussed and return precautions discussed.   Vivi BarrackMatthew R Zan Triska DPM

## 2017-11-25 ENCOUNTER — Encounter: Payer: Self-pay | Admitting: Physician Assistant

## 2017-11-25 ENCOUNTER — Other Ambulatory Visit: Payer: Self-pay

## 2017-11-25 ENCOUNTER — Ambulatory Visit (INDEPENDENT_AMBULATORY_CARE_PROVIDER_SITE_OTHER): Payer: BLUE CROSS/BLUE SHIELD | Admitting: Physician Assistant

## 2017-11-25 VITALS — BP 126/88 | HR 76 | Temp 98.0°F | Resp 17 | Ht 66.0 in | Wt 239.8 lb

## 2017-11-25 DIAGNOSIS — R21 Rash and other nonspecific skin eruption: Secondary | ICD-10-CM | POA: Diagnosis not present

## 2017-11-25 MED ORDER — PREDNISONE 10 MG PO TABS: ORAL_TABLET | ORAL | 0 refills | Status: AC

## 2017-11-25 NOTE — Progress Notes (Signed)
Patient presents to clinic today c/o pruritic and bumpy rash of his arms and face x 1 week. Notes symptoms started after using powder paint. Denies fever, chills. Notes the rash sometimes burns as well. Denies recent travel or sick contact. Denies change in soaps, lotions or hygiene products.   Past Medical History:  Diagnosis Date  . Allergy   . Environmental allergies   . Seasonal allergies     Current Outpatient Medications on File Prior to Visit  Medication Sig Dispense Refill  . meloxicam (MOBIC) 15 MG tablet Take 1 tablet (15 mg total) by mouth daily. 30 tablet 2   No current facility-administered medications on file prior to visit.     No Known Allergies  Family History  Problem Relation Age of Onset  . Hypertension Father        Living  . Cervical cancer Mother        Living  . Hypertension Paternal Grandmother   . Hypertension Paternal Grandfather   . Stroke Paternal Grandfather   . Healthy Brother        x3  . Healthy Sister        x4  . Healthy Daughter        x1  . Diabetes Maternal Grandmother     Social History   Socioeconomic History  . Marital status: Married    Spouse name: Not on file  . Number of children: Not on file  . Years of education: Not on file  . Highest education level: Not on file  Occupational History  . Not on file  Social Needs  . Financial resource strain: Not on file  . Food insecurity:    Worry: Not on file    Inability: Not on file  . Transportation needs:    Medical: Not on file    Non-medical: Not on file  Tobacco Use  . Smoking status: Never Smoker  . Smokeless tobacco: Never Used  Substance and Sexual Activity  . Alcohol use: No    Alcohol/week: 0.0 standard drinks  . Drug use: No  . Sexual activity: Yes  Lifestyle  . Physical activity:    Days per week: Not on file    Minutes per session: Not on file  . Stress: Not on file  Relationships  . Social connections:    Talks on phone: Not on file    Gets  together: Not on file    Attends religious service: Not on file    Active member of club or organization: Not on file    Attends meetings of clubs or organizations: Not on file    Relationship status: Not on file  Other Topics Concern  . Not on file  Social History Narrative  . Not on file   Review of Systems - See HPI.  All other ROS are negative.  BP 126/88   Pulse 76   Temp 98 F (36.7 C) (Oral)   Resp 17   Ht 5\' 6"  (1.676 m)   Wt 239 lb 12.8 oz (108.8 kg)   SpO2 98%   BMI 38.70 kg/m   Physical Exam  Constitutional: He is oriented to person, place, and time. He appears well-developed and well-nourished.  HENT:  Head: Normocephalic and atraumatic.  Eyes: Conjunctivae and EOM are normal.  Neck: Neck supple.  Cardiovascular: Normal rate, regular rhythm, normal heart sounds and intact distal pulses.  Pulmonary/Chest: Effort normal and breath sounds normal.  Neurological: He is alert and oriented to person,  place, and time.  Skin: Rash noted. Rash is maculopapular (of bilateral forearms and forehead. Mild erythema noted. No vesicular lesion).  Vitals reviewed.  Recent Results (from the past 2160 hour(s))  CBC with Differential/Platelet     Status: None   Collection Time: 09/24/17  2:23 PM  Result Value Ref Range   WBC 4.7 4.0 - 10.5 K/uL   RBC 4.98 4.22 - 5.81 Mil/uL   Hemoglobin 14.6 13.0 - 17.0 g/dL   HCT 16.143.9 09.639.0 - 04.552.0 %   MCV 88.1 78.0 - 100.0 fl   MCHC 33.2 30.0 - 36.0 g/dL   RDW 40.913.9 81.111.5 - 91.415.5 %   Platelets 264.0 150.0 - 400.0 K/uL   Neutrophils Relative % 50.4 43.0 - 77.0 %   Lymphocytes Relative 39.1 12.0 - 46.0 %   Monocytes Relative 9.6 3.0 - 12.0 %   Eosinophils Relative 0.6 0.0 - 5.0 %   Basophils Relative 0.3 0.0 - 3.0 %   Neutro Abs 2.4 1.4 - 7.7 K/uL   Lymphs Abs 1.8 0.7 - 4.0 K/uL   Monocytes Absolute 0.5 0.1 - 1.0 K/uL   Eosinophils Absolute 0.0 0.0 - 0.7 K/uL   Basophils Absolute 0.0 0.0 - 0.1 K/uL  Comprehensive metabolic panel     Status:  None   Collection Time: 09/24/17  2:23 PM  Result Value Ref Range   Sodium 137 135 - 145 mEq/L   Potassium 4.3 3.5 - 5.1 mEq/L   Chloride 102 96 - 112 mEq/L   CO2 27 19 - 32 mEq/L   Glucose, Bld 92 70 - 99 mg/dL   BUN 9 6 - 23 mg/dL   Creatinine, Ser 7.820.79 0.40 - 1.50 mg/dL   Total Bilirubin 0.4 0.2 - 1.2 mg/dL   Alkaline Phosphatase 81 39 - 117 U/L   AST 25 0 - 37 U/L   ALT 47 0 - 53 U/L   Total Protein 7.7 6.0 - 8.3 g/dL   Albumin 4.3 3.5 - 5.2 g/dL   Calcium 9.5 8.4 - 95.610.5 mg/dL   GFR 213.08142.58 >65.78>60.00 mL/min  Lipid panel     Status: Abnormal   Collection Time: 09/24/17  2:23 PM  Result Value Ref Range   Cholesterol 256 (H) 0 - 200 mg/dL    Comment: ATP III Classification       Desirable:  < 200 mg/dL               Borderline High:  200 - 239 mg/dL          High:  > = 469240 mg/dL   Triglycerides 629.5120.0 0.0 - 149.0 mg/dL    Comment: Normal:  <284<150 mg/dLBorderline High:  150 - 199 mg/dL   HDL 13.2434.90 (L) >40.10>39.00 mg/dL   VLDL 27.224.0 0.0 - 53.640.0 mg/dL   LDL Cholesterol 644197 (H) 0 - 99 mg/dL   Total CHOL/HDL Ratio 7     Comment:                Men          Women1/2 Average Risk     3.4          3.3Average Risk          5.0          4.42X Average Risk          9.6          7.13X Average Risk          15.0  11.0                       NonHDL 221.25     Comment: NOTE:  Non-HDL goal should be 30 mg/dL higher than patient's LDL goal (i.e. LDL goal of < 70 mg/dL, would have non-HDL goal of < 100 mg/dL)  Hemoglobin J1BA1c     Status: None   Collection Time: 09/24/17  2:23 PM  Result Value Ref Range   Hgb A1c MFr Bld 5.5 4.6 - 6.5 %    Comment: Glycemic Control Guidelines for People with Diabetes:Non Diabetic:  <6%Goal of Therapy: <7%Additional Action Suggested:  >8%    Assessment/Plan: 1. Rash Seems allergic dermatitis. Supportive measures and OTC medications reviewed. Start prednisone taper. Follow-up if not resolving.    Piedad ClimesWilliam Cody Motty Borin, PA-C

## 2017-11-25 NOTE — Patient Instructions (Signed)
Please keep well-hydrated and get plenty of rest.  Avoid any scented or dyed lotions to the skin. Cool is better for itch. You can continue your Benadryl for itch if needed. Take the prednisone taper as directed.  Follow-up if symptoms are not resolving.

## 2017-12-03 IMAGING — US US ART/VEN ABD/PELV/SCROTUM DOPPLER LTD
1 series · 13 of 25 positions shown · non-contrast
Comparison: None.

CLINICAL DATA: 35-year-old with sudden onset of left testicular
pain 8 hours ago.

EXAM:
SCROTAL ULTRASOUND
DOPPLER ULTRASOUND OF THE TESTICLES
TECHNIQUE: Complete ultrasound examination of the testicles, epididymis, and
other scrotal structures was performed. Color and spectral Doppler
ultrasound were also utilized to evaluate blood flow to the
testicles.

[Series 1: us art/ven abd/pelv/scrotum doppler ltd · 0.06mm/px · 13 of 70 slices shown]
[im 1/70]
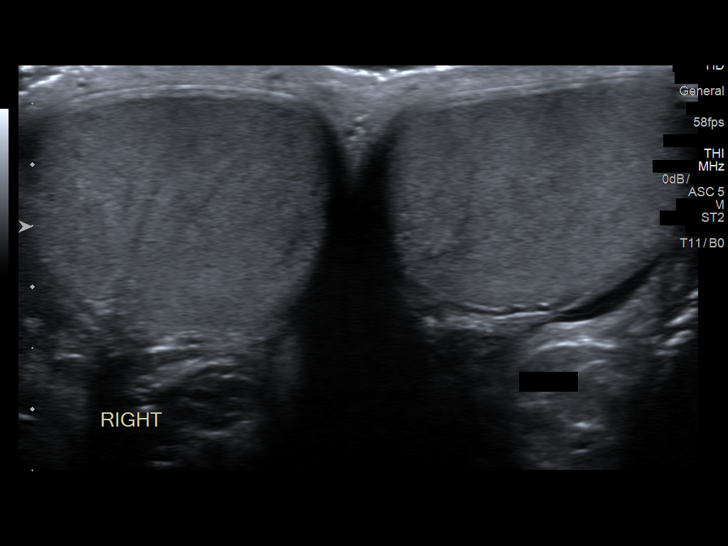
[im 6/70]
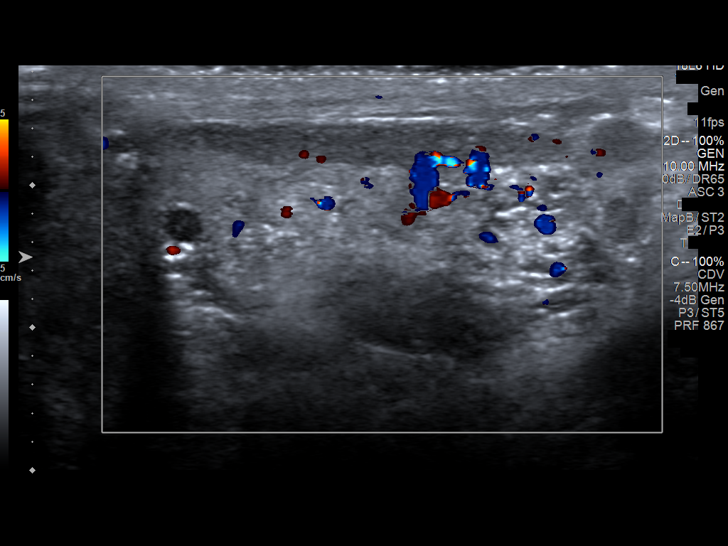
[im 12/70]
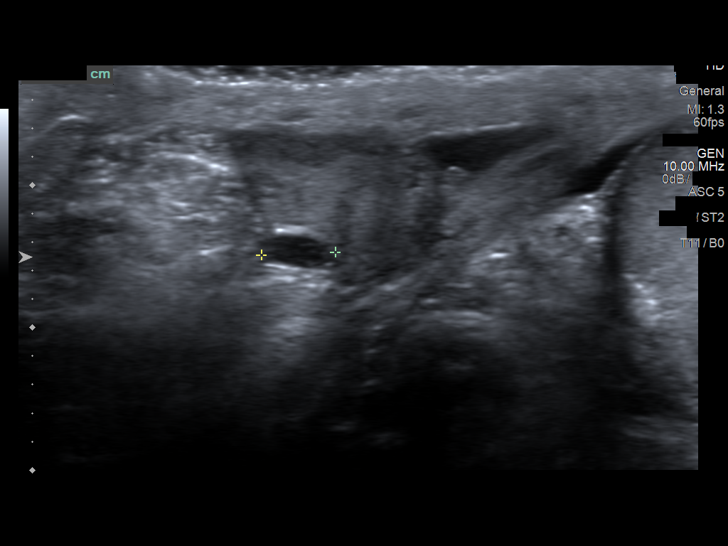
[im 18/70]
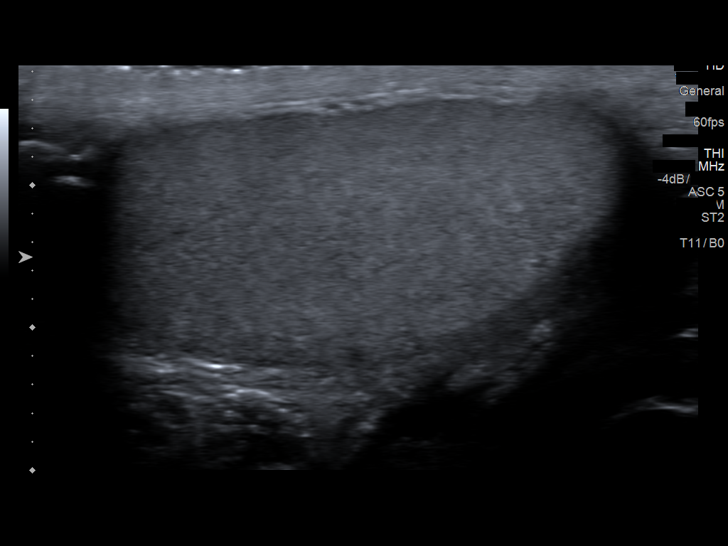
[im 24/70]
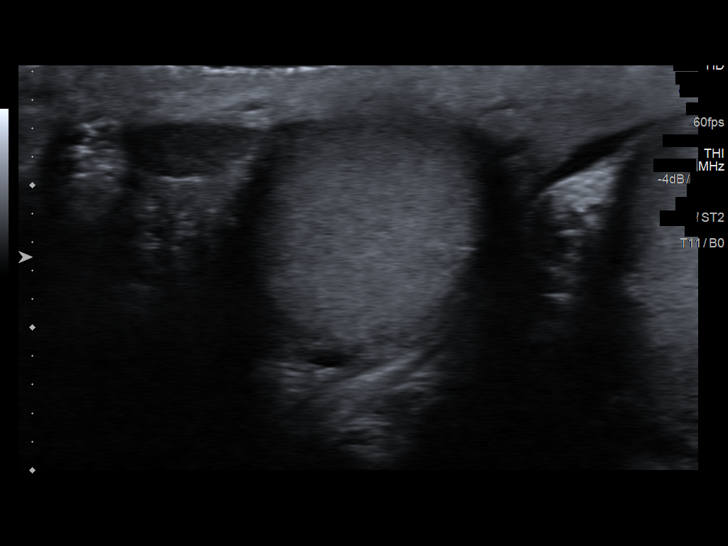
[im 29/70]
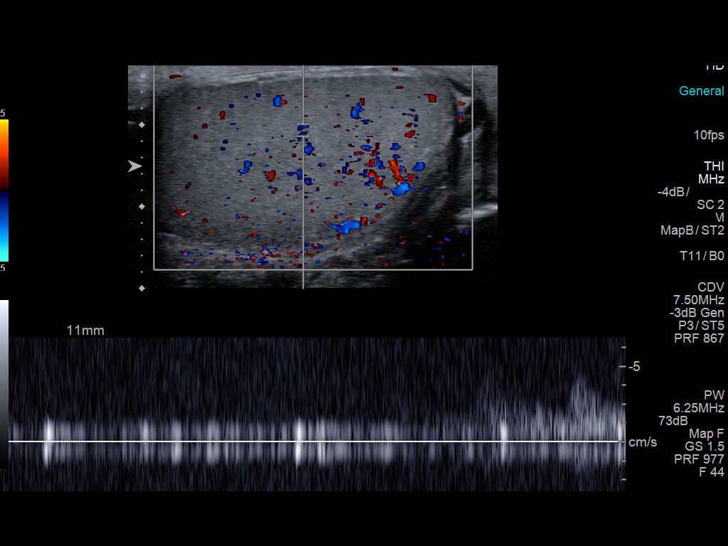
[im 35/70]
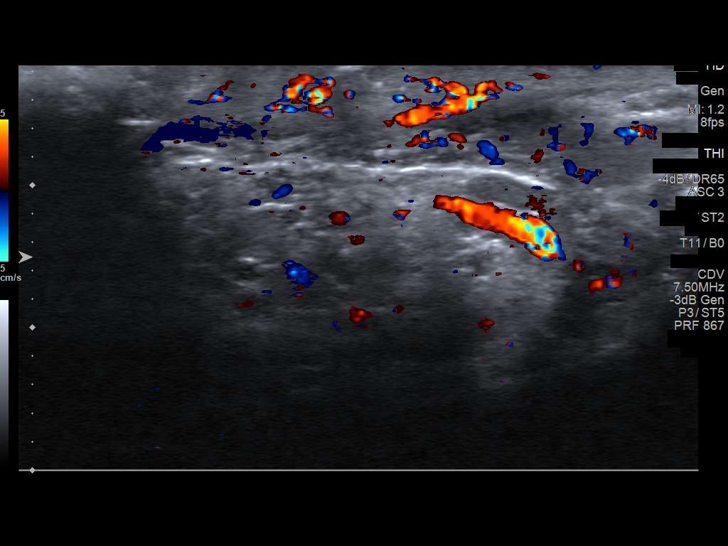
[im 41/70]
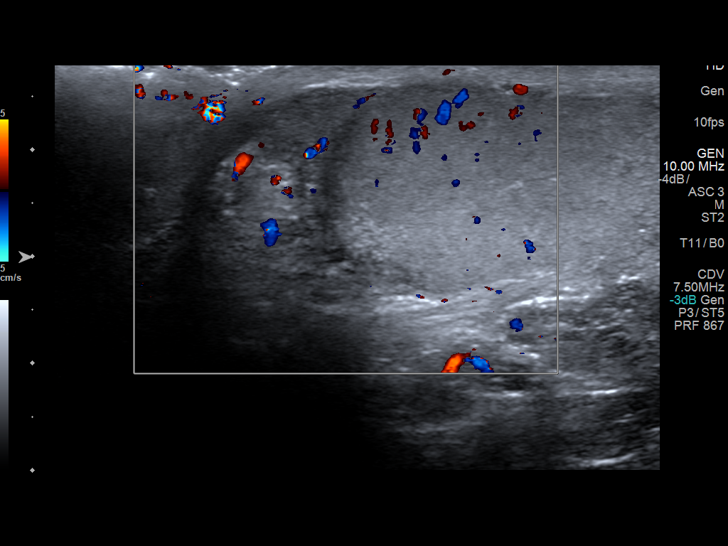
[im 47/70]
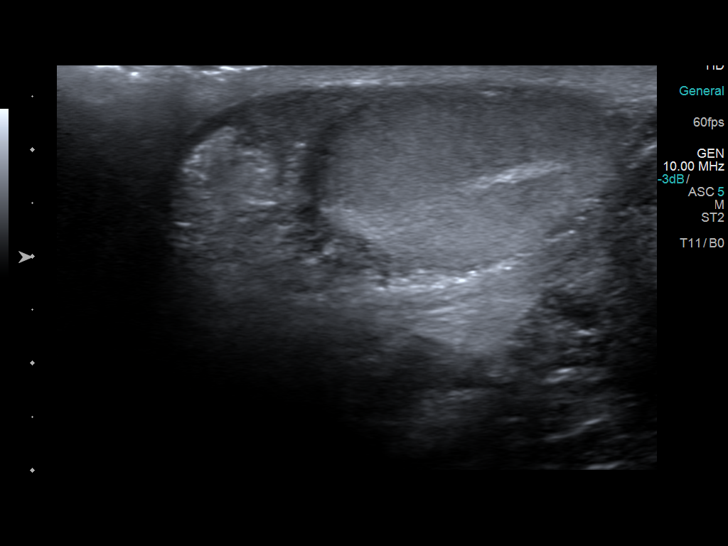
[im 52/70]
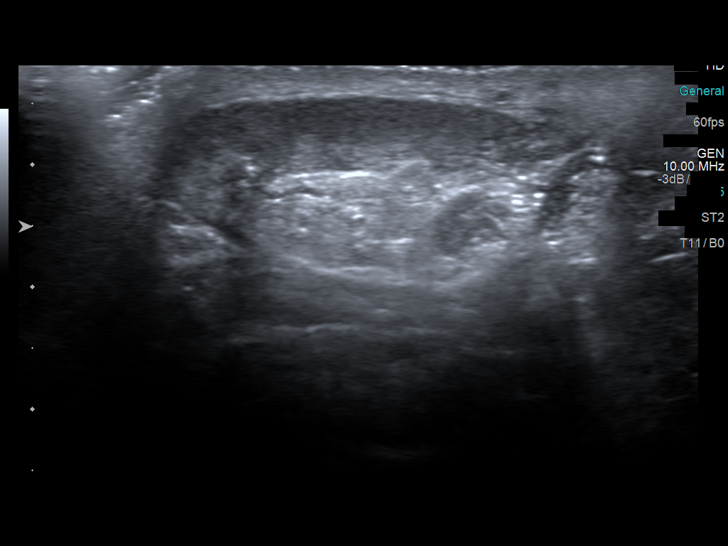
[im 58/70]
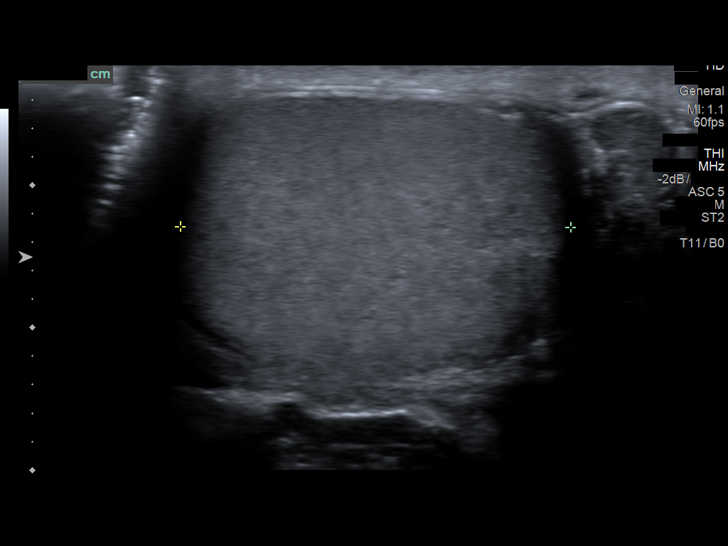
[im 64/70]
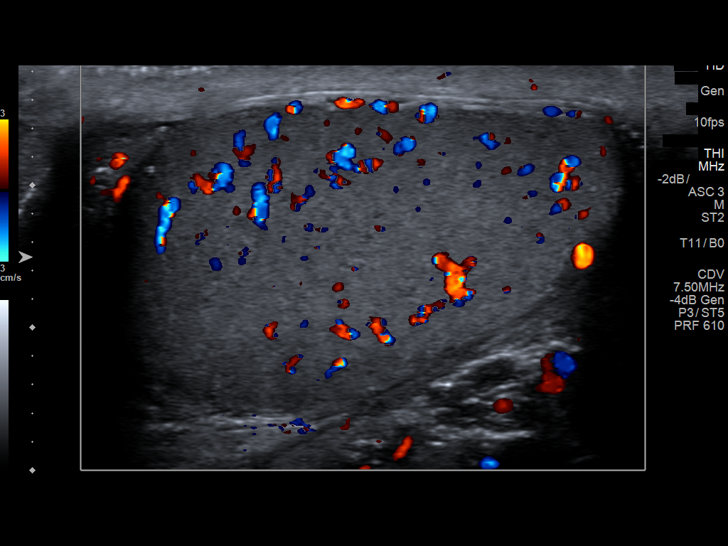
[im 70/70]
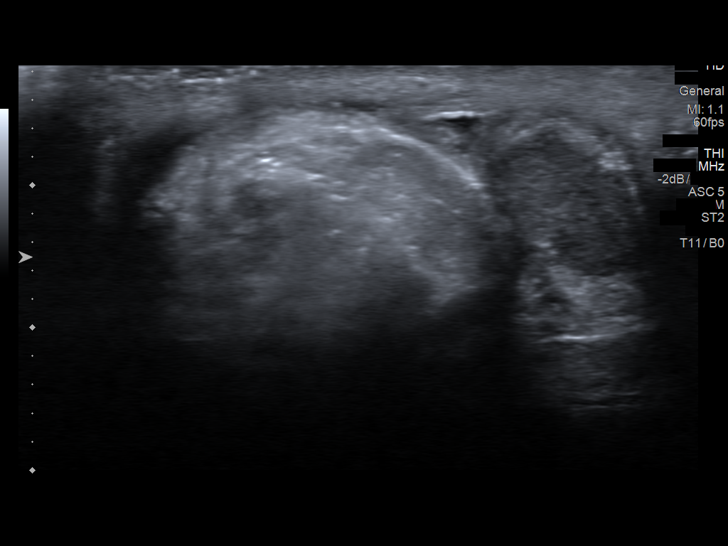

[13 of 25 positions shown; findings below may reference images not displayed]

FINDINGS: Right testicle

Measurements: 4.2 x 2.0 x 2.5 cm. No mass or microlithiasis
visualized. Normal blood flow with color Doppler.

Left testicle

Measurements: 3.7 x 2.0 x 2.7 cm. No mass or microlithiasis
visualized. Normal blood flow with color Doppler.

Right epididymis: Tiny epididymal cyst or spermatocele, measuring 5
mm maximally. Otherwise unremarkable.

Left epididymis: Mild heterogeneity within the epididymal head,
corresponding with the area of pain. No associated abnormal blood
flow.

Hydrocele:  Small left-sided hydrocele.

Varicocele:  None visualized.

Pulsed Doppler interrogation of both testes demonstrates normal low
resistance arterial and venous waveforms bilaterally.
IMPRESSION: 1. Both testes appear normal.  No evidence of testicular torsion.
2. Mild heterogeneity within the left epididymal head, corresponding
with the area of pain. This may be inflammatory, although there is
no significant hypervascularity on color Doppler. Small left-sided
hydrocele.

## 2017-12-25 ENCOUNTER — Other Ambulatory Visit: Payer: Self-pay | Admitting: Podiatry

## 2018-07-10 ENCOUNTER — Encounter: Payer: Self-pay | Admitting: Physician Assistant

## 2018-07-10 ENCOUNTER — Ambulatory Visit (INDEPENDENT_AMBULATORY_CARE_PROVIDER_SITE_OTHER): Payer: BLUE CROSS/BLUE SHIELD | Admitting: Physician Assistant

## 2018-07-10 ENCOUNTER — Other Ambulatory Visit: Payer: Self-pay

## 2018-07-10 VITALS — BP 137/94 | HR 71 | Temp 96.7°F | Resp 16 | Ht 66.0 in | Wt 235.0 lb

## 2018-07-10 DIAGNOSIS — H9201 Otalgia, right ear: Secondary | ICD-10-CM | POA: Diagnosis not present

## 2018-07-10 MED ORDER — AMOXICILLIN 875 MG PO TABS: 875.0000 mg | ORAL_TABLET | Freq: Two times a day (BID) | ORAL | 0 refills | Status: DC

## 2018-07-10 MED ORDER — MULTIVITAMIN MEN PO TABS: 1.0000 | ORAL_TABLET | Freq: Every day | ORAL | 0 refills | Status: AC

## 2018-07-10 MED ORDER — FLUTICASONE PROPIONATE 50 MCG/ACT NA SUSP: 2.0000 | Freq: Every day | NASAL | 0 refills | Status: DC

## 2018-07-10 NOTE — Progress Notes (Signed)
I have discussed the procedure for the virtual visit with the patient who has given consent to proceed with assessment and treatment.   Amorah Sebring S Aela Bohan, CMA     

## 2018-07-10 NOTE — Progress Notes (Signed)
Virtual Visit via Video Note I connected with Glenn Butler on 07/10/18 at  8:30 AM EDT by a video enabled telemedicine application and verified that I am speaking with the correct person using two identifiers.   I discussed the limitations of evaluation and management by telemedicine and the availability of in person appointments. The patient expressed understanding and agreed to proceed.  History of Present Illness: Patient endorses 3 days of  Constant, aching left ear pain. Notes some nasal congestion and rhinorrhea due to his seasonal allergies. Denies fever, chills, chest congestion cough, sinus pressure or sinus pain. Denies decreased hearing or drainage from the ear. Denies noted swelling of external ear.   Observations/Objective: Well-developed, well-nourished in no acute distress resting comfortably in chair at home. Head is normocephalic, atraumatic. No labored breathing. Speech is normal and non-pressured with logical thought content. There is pain with patient wiggling L ear and pushing on tragus. No noted external ear swelling on limited exam via video.  Assessment and Plan: 1. Acute otalgia, right Suspect combination of ETD 2/2 allergic rhinitis, and AOM. Start Amoxicillin and Flonase. Continue antihistamine. Supportive measures and OTC medications reviewed with patient.  - amoxicillin (AMOXIL) 875 MG tablet; Take 1 tablet (875 mg total) by mouth 2 (two) times daily.  Dispense: 20 tablet; Refill: 0 - fluticasone (FLONASE) 50 MCG/ACT nasal spray; Place 2 sprays into both nostrils daily.  Dispense: 16 g; Refill: 0  Follow Up Instructions: Follow-up PRN if symptoms are not resolving.    I discussed the assessment and treatment plan with the patient. The patient was provided an opportunity to ask questions and all were answered. The patient agreed with the plan and demonstrated an understanding of the instructions.   The patient was advised to call back or seek an in-person  evaluation if the symptoms worsen or if the condition fails to improve as anticipated.  Piedad Climes, PA-C

## 2018-07-28 ENCOUNTER — Encounter: Payer: Self-pay | Admitting: Physician Assistant

## 2018-07-28 NOTE — Telephone Encounter (Signed)
Copied from CRM [id number]. Topic: Quick Communication - See Telephone Encounter >> Jul 28, 2018  4:14 PM Terisa Starr wrote: CRM for notification. See Telephone encounter for: 07/28/18. Patient called back and said he needs to speak with Selena Batten, I assume it is regarding the appt. Please call patient when office opens back up. Thanks

## 2018-07-31 ENCOUNTER — Ambulatory Visit (INDEPENDENT_AMBULATORY_CARE_PROVIDER_SITE_OTHER): Payer: BLUE CROSS/BLUE SHIELD | Admitting: Physician Assistant

## 2018-07-31 ENCOUNTER — Other Ambulatory Visit: Payer: Self-pay

## 2018-07-31 ENCOUNTER — Encounter: Payer: Self-pay | Admitting: Physician Assistant

## 2018-07-31 VITALS — BP 139/94 | HR 66 | Ht 66.0 in | Wt 233.0 lb

## 2018-07-31 DIAGNOSIS — E6609 Other obesity due to excess calories: Secondary | ICD-10-CM

## 2018-07-31 DIAGNOSIS — E66812 Obesity, class 2: Secondary | ICD-10-CM

## 2018-07-31 DIAGNOSIS — I10 Essential (primary) hypertension: Secondary | ICD-10-CM | POA: Diagnosis not present

## 2018-07-31 MED ORDER — HYDROCHLOROTHIAZIDE 12.5 MG PO TABS: 12.5000 mg | ORAL_TABLET | Freq: Every day | ORAL | 1 refills | Status: DC

## 2018-07-31 NOTE — Progress Notes (Signed)
Virtual Visit via Video   I connected with patient on 07/31/18 at  8:20 AM EDT by a video enabled telemedicine application and verified that I am speaking with the correct person using two identifiers.  Location patient: Home Location provider: [facility], [facility] Persons participating in the virtual visit: Patient, Provider, CMA (Patina Moore)  I discussed the limitations of evaluation and management by telemedicine and the availability of in person appointments. The patient expressed understanding and agreed to proceed.  Subjective:   HPI:   Patient presents via [email or web address] to discuss elevated BP measurements over the past several months. BP at dentist last week 145/105. Home BP measurements averaging in the 130-140s over mid 90s-110. Notes diet is terrible currently -- a lot of fast food. Does eat 1 meal at home each day that wife cooks and second meal is fast food and late at night due to work schedule. Does drink a lot of sodas but has cut back on this since Monday and substituting water. Patient denies chest pain, palpitations, lightheadedness, dizziness, vision changes or frequent headaches.  BP Readings from Last 3 Encounters:  07/31/18 (!) 139/94  07/10/18 (!) 137/94  11/25/17 126/88   Body mass index is 37.61 kg/m.  ROS:   See pertinent positives and negatives per HPI.  Patient Active Problem List   Diagnosis Date Noted  . Class 2 obesity due to excess calories without serious comorbidity in adult 09/24/2017  . Plantar fasciitis of left foot 09/24/2017  . Allergic rhinitis 07/14/2014  . Need for prophylactic vaccination with combined diphtheria-tetanus-pertussis (DTP) vaccine 10/20/2013  . Visit for preventive health examination 10/20/2013  . GERD (gastroesophageal reflux disease) 10/20/2013    Social History   Tobacco Use  . Smoking status: Never Smoker  . Smokeless tobacco: Never Used  Substance Use Topics  . Alcohol use: No    Alcohol/week: 0.0  standard drinks    Current Outpatient Medications:  .  fluticasone (FLONASE) 50 MCG/ACT nasal spray, Place 2 sprays into both nostrils daily., Disp: 16 g, Rfl: 0 .  Multiple Vitamins-Minerals (MULTIVITAMIN MEN) TABS, Take 1 tablet by mouth daily., Disp: 90 tablet, Rfl: 0  No Known Allergies  Objective:   BP (!) 139/94   Pulse 66   Ht 5\' 6"  (1.676 m)   Wt 233 lb (105.7 kg)   BMI 37.61 kg/m   Patient is well-developed, well-nourished in no acute distress.  Resting comfortably at home.  Head is normocephalic, atraumatic.  No labored breathing.  Speech is clear and coherent with logical contest.  Patient is alert and oriented at baseline.   Assessment and Plan:   1. Essential hypertension BP above goal for some time. Diet and weight are significant contributors. We are going to work on TLC over the next 3 months. DASH and HH diet reviewed. Handout sent to MyChart. Will work on 150 minutes of aerobic activity per week -- mild to moderate intensity for now. Will increase as he is more conditioned. Start 12.5 mg HCTZ for now. Check BP daily and record. Follow-up 2 weeks via video visit.  - hydrochlorothiazide (HYDRODIURIL) 12.5 MG tablet; Take 1 tablet (12.5 mg total) by mouth daily.  Dispense: 30 tablet; Refill: 1  2. Class 2 obesity due to excess calories without serious comorbidity in adult, unspecified BMI Body mass index is 37.61 kg/m. We are going to work on TLC over the next 3 months. DASH and HH diet reviewed. Handout sent to MyChart. Will work on 150 minutes of  aerobic activity per week -- mild to moderate intensity for now.     Piedad ClimesWilliam Cody Deone Leifheit, [place] JerseyPA-C 07/31/2018

## 2018-07-31 NOTE — Patient Instructions (Signed)
Instructions sent to MyChart.  Please start the medication once daily as directed. Really work on diet, cutting out the fast wood when possible. Try out the meal prepping we discussed.  This way you have meals ready when needed but you know the quality of what you are eating! Make sure to get in a goal of 150 minutes of aerobic exercise per week. Keep up taking your multivitamin daily.  Check BP daily and record. We will follow-up via Video in 2 week.   DASH Eating Plan DASH stands for "Dietary Approaches to Stop Hypertension." The DASH eating plan is a healthy eating plan that has been shown to reduce high blood pressure (hypertension). It may also reduce your risk for type 2 diabetes, heart disease, and stroke. The DASH eating plan may also help with weight loss. What are tips for following this plan?  General guidelines  Avoid eating more than 2,300 mg (milligrams) of salt (sodium) a day. If you have hypertension, you may need to reduce your sodium intake to 1,500 mg a day.  Limit alcohol intake to no more than 1 drink a day for nonpregnant women and 2 drinks a day for men. One drink equals 12 oz of beer, 5 oz of wine, or 1 oz of hard liquor.  Work with your health care provider to maintain a healthy body weight or to lose weight. Ask what an ideal weight is for you.  Get at least 30 minutes of exercise that causes your heart to beat faster (aerobic exercise) most days of the week. Activities may include walking, swimming, or biking.  Work with your health care provider or diet and nutrition specialist (dietitian) to adjust your eating plan to your individual calorie needs. Reading food labels   Check food labels for the amount of sodium per serving. Choose foods with less than 5 percent of the Daily Value of sodium. Generally, foods with less than 300 mg of sodium per serving fit into this eating plan.  To find whole grains, look for the word "whole" as the first word in the  ingredient list. Shopping  Buy products labeled as "low-sodium" or "no salt added."  Buy fresh foods. Avoid canned foods and premade or frozen meals. Cooking  Avoid adding salt when cooking. Use salt-free seasonings or herbs instead of table salt or sea salt. Check with your health care provider or pharmacist before using salt substitutes.  Do not fry foods. Cook foods using healthy methods such as baking, boiling, grilling, and broiling instead.  Cook with heart-healthy oils, such as olive, canola, soybean, or sunflower oil. Meal planning  Eat a balanced diet that includes: ? 5 or more servings of fruits and vegetables each day. At each meal, try to fill half of your plate with fruits and vegetables. ? Up to 6-8 servings of whole grains each day. ? Less than 6 oz of lean meat, poultry, or fish each day. A 3-oz serving of meat is about the same size as a deck of cards. One egg equals 1 oz. ? 2 servings of low-fat dairy each day. ? A serving of nuts, seeds, or beans 5 times each week. ? Heart-healthy fats. Healthy fats called Omega-3 fatty acids are found in foods such as flaxseeds and coldwater fish, like sardines, salmon, and mackerel.  Limit how much you eat of the following: ? Canned or prepackaged foods. ? Food that is high in trans fat, such as fried foods. ? Food that is high in saturated fat,  such as fatty meat. ? Sweets, desserts, sugary drinks, and other foods with added sugar. ? Full-fat dairy products.  Do not salt foods before eating.  Try to eat at least 2 vegetarian meals each week.  Eat more home-cooked food and less restaurant, buffet, and fast food.  When eating at a restaurant, ask that your food be prepared with less salt or no salt, if possible. What foods are recommended? The items listed may not be a complete list. Talk with your dietitian about what dietary choices are best for you. Grains Whole-grain or whole-wheat bread. Whole-grain or whole-wheat  pasta. Brown rice. [place]ena Morrow. Bulgur. Whole-grain and low-sodium cereals. Pita bread. Low-fat, low-sodium crackers. Whole-wheat flour tortillas. Vegetables Fresh or frozen vegetables (raw, steamed, roasted, or grilled). Low-sodium or reduced-sodium tomato and vegetable juice. Low-sodium or reduced-sodium tomato sauce and tomato paste. Low-sodium or reduced-sodium canned vegetables. Fruits All fresh, dried, or frozen fruit. Canned fruit in natural juice (without added sugar). Meat and other protein foods Skinless chicken or [place]. Ground chicken or [place]. Pork with fat trimmed off. Fish and seafood. Egg whites. Dried beans, peas, or lentils. Unsalted nuts, nut butters, and seeds. Unsalted canned beans. Lean cuts of beef with fat trimmed off. Low-sodium, lean deli meat. Dairy Low-fat (1%) or fat-free (skim) milk. Fat-free, low-fat, or reduced-fat cheeses. Nonfat, low-sodium ricotta or cottage cheese. Low-fat or nonfat yogurt. Low-fat, low-sodium cheese. Fats and oils Soft margarine without trans fats. Vegetable oil. Low-fat, reduced-fat, or light mayonnaise and salad dressings (reduced-sodium). Canola, safflower, olive, soybean, and sunflower oils. Avocado. Seasoning and other foods Herbs. Spices. Seasoning mixes without salt. Unsalted popcorn and pretzels. Fat-free sweets. What foods are not recommended? The items listed may not be a complete list. Talk with your dietitian about what dietary choices are best for you. Grains Baked goods made with fat, such as croissants, muffins, or some breads. Dry pasta or rice meal packs. Vegetables Creamed or fried vegetables. Vegetables in a cheese sauce. Regular canned vegetables (not low-sodium or reduced-sodium). Regular canned tomato sauce and paste (not low-sodium or reduced-sodium). Regular tomato and vegetable juice (not low-sodium or reduced-sodium). Angie Fava. Olives. Fruits Canned fruit in a light or heavy syrup. Fried fruit. Fruit in cream or  butter sauce. Meat and other protein foods Fatty cuts of meat. Ribs. Fried meat. Berniece Salines. Sausage. Bologna and other processed lunch meats. Salami. Fatback. Hotdogs. Bratwurst. Salted nuts and seeds. Canned beans with added salt. Canned or smoked fish. Whole eggs or egg yolks. Chicken or [place] with skin. Dairy Whole or 2% milk, cream, and half-and-half. Whole or full-fat cream cheese. Whole-fat or sweetened yogurt. Full-fat cheese. Nondairy creamers. Whipped toppings. Processed cheese and cheese spreads. Fats and oils Butter. Stick margarine. Lard. Shortening. Ghee. Bacon fat. Tropical oils, such as coconut, palm kernel, or palm oil. Seasoning and other foods Salted popcorn and pretzels. Onion salt, garlic salt, seasoned salt, table salt, and sea salt. Worcestershire sauce. Tartar sauce. Barbecue sauce. Teriyaki sauce. Soy sauce, including reduced-sodium. Steak sauce. Canned and packaged gravies. Fish sauce. Oyster sauce. Cocktail sauce. Horseradish that you find on the shelf. Ketchup. Mustard. Meat flavorings and tenderizers. Bouillon cubes. Hot sauce and Tabasco sauce. Premade or packaged marinades. Premade or packaged taco seasonings. Relishes. Regular salad dressings. Where to find more information:  National Heart, Lung, and Benjamin Perez: https://wilson-eaton.com/  American Heart Association: www.heart.org Summary  The DASH eating plan is a healthy eating plan that has been shown to reduce high blood pressure (hypertension). It may also reduce your  risk for type 2 diabetes, heart disease, and stroke.  With the DASH eating plan, you should limit salt (sodium) intake to 2,300 mg a day. If you have hypertension, you may need to reduce your sodium intake to 1,500 mg a day.  When on the DASH eating plan, aim to eat more fresh fruits and vegetables, whole grains, lean proteins, low-fat dairy, and heart-healthy fats.  Work with your health care provider or diet and nutrition specialist (dietitian) to  adjust your eating plan to your individual calorie needs. This information is not intended to replace advice given to you by your health care provider. Make sure you discuss any questions you have with your health care provider. Document Released: 03/15/2011 Document Revised: 03/19/2016 Document Reviewed: 03/19/2016 Elsevier Interactive Patient Education  2019 Elsevier Inc.   Heart-Healthy Eating Plan Heart-healthy meal planning includes:  Eating less unhealthy fats.  Eating more healthy fats.  Making other changes in your diet. Talk with your doctor or a diet specialist (dietitian) to create an eating plan that is right for you. What are tips for following this plan? Cooking Avoid frying your food. Try to bake, boil, grill, or broil it instead. You can also reduce fat by:  Removing the skin from poultry.  Removing all visible fats from meats.  Steaming vegetables in water or broth. Meal planning   At meals, divide your plate into four equal parts: ? Fill one-half of your plate with vegetables and green salads. ? Fill one-fourth of your plate with whole grains. ? Fill one-fourth of your plate with lean protein foods.  Eat 4-5 servings of vegetables per day. A serving of vegetables is: ? 1 cup of raw or cooked vegetables. ? 2 cups of raw leafy greens.  Eat 4-5 servings of fruit per day. A serving of fruit is: ? 1 medium whole fruit. ?  cup of dried fruit. ?  cup of fresh, frozen, or canned fruit. ?  cup of 100% fruit juice.  Eat more foods that have soluble fiber. These are apples, broccoli, carrots, beans, peas, and barley. Try to get 20-30 g of fiber per day.  Eat 4-5 servings of nuts, legumes, and seeds per week: ? 1 serving of dried beans or legumes equals  cup after being cooked. ? 1 serving of nuts is  cup. ? 1 serving of seeds equals 1 tablespoon. General information  Eat more home-cooked food. Eat less restaurant, buffet, and fast food.  Limit or avoid  alcohol.  Limit foods that are high in starch and sugar.  Avoid fried foods.  Lose weight if you are overweight.  Keep track of how much salt (sodium) you eat. This is important if you have high blood pressure. Ask your doctor to tell you more about this.  Try to add vegetarian meals each week. Fats  Choose healthy fats. These include olive oil and canola oil, flaxseeds, walnuts, almonds, and seeds.  Eat more omega-3 fats. These include salmon, mackerel, sardines, tuna, flaxseed oil, and ground flaxseeds. Try to eat fish at least 2 times each week.  Check food labels. Avoid foods with trans fats or high amounts of saturated fat.  Limit saturated fats. ? These are often found in animal products, such as meats, butter, and cream. ? These are also found in plant foods, such as palm oil, palm kernel oil, and coconut oil.  Avoid foods with partially hydrogenated oils in them. These have trans fats. Examples are stick margarine, some tub margarines, cookies, crackers,  and other baked goods. What foods can I eat? Fruits All fresh, canned (in natural juice), or frozen fruits. Vegetables Fresh or frozen vegetables (raw, steamed, roasted, or grilled). Green salads. Grains Most grains. Choose whole wheat and whole grains most of the time. Rice and pasta, including brown rice and pastas made with whole wheat. Meats and other proteins Lean, well-trimmed beef, veal, pork, and lamb. Chicken and [place] without skin. All fish and shellfish. Wild duck, rabbit, pheasant, and venison. Egg whites or low-cholesterol egg substitutes. Dried beans, peas, lentils, and tofu. Seeds and most nuts. Dairy Low-fat or nonfat cheeses, including ricotta and mozzarella. Skim or 1% milk that is liquid, powdered, or evaporated. Buttermilk that is made with low-fat milk. Nonfat or low-fat yogurt. Fats and oils Non-hydrogenated (trans-free) margarines. Vegetable oils, including soybean, sesame, sunflower, olive, peanut,  safflower, corn, canola, and cottonseed. Salad dressings or mayonnaise made with a vegetable oil. Beverages Mineral water. Coffee and tea. Diet carbonated beverages. Sweets and desserts Sherbet, gelatin, and fruit ice. Small amounts of dark chocolate. Limit all sweets and desserts. Seasonings and condiments All seasonings and condiments. The items listed above may not be a complete list of foods and drinks you can eat. Contact a dietitian for more options. What foods should I avoid? Fruits Canned fruit in heavy syrup. Fruit in cream or butter sauce. Fried fruit. Limit coconut. Vegetables Vegetables cooked in cheese, cream, or butter sauce. Fried vegetables. Grains Breads that are made with saturated or trans fats, oils, or whole milk. Croissants. Sweet rolls. Donuts. High-fat crackers, such as cheese crackers. Meats and other proteins Fatty meats, such as hot dogs, ribs, sausage, bacon, rib-eye roast or steak. High-fat deli meats, such as salami and bologna. Caviar. Domestic duck and goose. Organ meats, such as liver. Dairy Cream, sour cream, cream cheese, and creamed cottage cheese. Whole-milk cheeses. Whole or 2% milk that is liquid, evaporated, or condensed. Whole buttermilk. Cream sauce or high-fat cheese sauce. Yogurt that is made from whole milk. Fats and oils Meat fat, or shortening. Cocoa butter, hydrogenated oils, palm oil, coconut oil, palm kernel oil. Solid fats and shortenings, including bacon fat, salt pork, lard, and butter. Nondairy cream substitutes. Salad dressings with cheese or sour cream. Beverages Regular sodas and juice drinks with added sugar. Sweets and desserts Frosting. Pudding. Cookies. Cakes. Pies. Milk chocolate or white chocolate. Buttered syrups. Full-fat ice cream or ice cream drinks. The items listed above may not be a complete list of foods and drinks to avoid. Contact a dietitian for more information. Summary  Heart-healthy meal planning includes eating  less unhealthy fats, eating more healthy fats, and making other changes in your diet.  Eat a balanced diet. This includes fruits and vegetables, low-fat or nonfat dairy, lean protein, nuts and legumes, whole grains, and heart-healthy oils and fats. This information is not intended to replace advice given to you by your health care provider. Make sure you discuss any questions you have with your health care provider. Document Released: 09/25/2011 Document Revised: 05/03/2017 Document Reviewed: 05/03/2017 Elsevier Interactive Patient Education  2019 [employer].

## 2018-07-31 NOTE — Progress Notes (Signed)
I have discussed the procedure for the virtual visit with the patient who has given consent to proceed with assessment and treatment.   Marri Mcneff S Jaymir Struble, CMA     

## 2018-08-02 ENCOUNTER — Other Ambulatory Visit: Payer: Self-pay | Admitting: Physician Assistant

## 2018-08-02 DIAGNOSIS — H9201 Otalgia, right ear: Secondary | ICD-10-CM

## 2018-08-19 ENCOUNTER — Other Ambulatory Visit: Payer: Self-pay | Admitting: Physician Assistant

## 2018-08-19 DIAGNOSIS — H9201 Otalgia, right ear: Secondary | ICD-10-CM

## 2018-08-22 ENCOUNTER — Other Ambulatory Visit: Payer: Self-pay | Admitting: Physician Assistant

## 2018-08-22 DIAGNOSIS — I10 Essential (primary) hypertension: Secondary | ICD-10-CM

## 2018-09-03 ENCOUNTER — Encounter: Payer: Self-pay | Admitting: Emergency Medicine

## 2018-11-18 ENCOUNTER — Other Ambulatory Visit: Payer: Self-pay | Admitting: Physician Assistant

## 2018-11-18 DIAGNOSIS — H9201 Otalgia, right ear: Secondary | ICD-10-CM

## 2019-02-09 DIAGNOSIS — Z20828 Contact with and (suspected) exposure to other viral communicable diseases: Secondary | ICD-10-CM | POA: Diagnosis not present

## 2019-04-07 DIAGNOSIS — Z3009 Encounter for other general counseling and advice on contraception: Secondary | ICD-10-CM | POA: Diagnosis not present

## 2019-06-18 DIAGNOSIS — Z302 Encounter for sterilization: Secondary | ICD-10-CM | POA: Diagnosis not present

## 2019-07-09 DIAGNOSIS — J029 Acute pharyngitis, unspecified: Secondary | ICD-10-CM | POA: Diagnosis not present

## 2019-09-09 ENCOUNTER — Other Ambulatory Visit: Payer: Self-pay | Admitting: Physician Assistant

## 2019-09-09 DIAGNOSIS — I10 Essential (primary) hypertension: Secondary | ICD-10-CM

## 2019-10-07 DIAGNOSIS — M9901 Segmental and somatic dysfunction of cervical region: Secondary | ICD-10-CM | POA: Diagnosis not present

## 2019-10-07 DIAGNOSIS — M9902 Segmental and somatic dysfunction of thoracic region: Secondary | ICD-10-CM | POA: Diagnosis not present

## 2019-10-07 DIAGNOSIS — M542 Cervicalgia: Secondary | ICD-10-CM | POA: Diagnosis not present

## 2019-10-07 DIAGNOSIS — M62838 Other muscle spasm: Secondary | ICD-10-CM | POA: Diagnosis not present

## 2019-10-12 DIAGNOSIS — M62838 Other muscle spasm: Secondary | ICD-10-CM | POA: Diagnosis not present

## 2019-10-12 DIAGNOSIS — M542 Cervicalgia: Secondary | ICD-10-CM | POA: Diagnosis not present

## 2019-10-12 DIAGNOSIS — M9902 Segmental and somatic dysfunction of thoracic region: Secondary | ICD-10-CM | POA: Diagnosis not present

## 2019-10-12 DIAGNOSIS — M9901 Segmental and somatic dysfunction of cervical region: Secondary | ICD-10-CM | POA: Diagnosis not present

## 2020-02-23 ENCOUNTER — Other Ambulatory Visit: Payer: Self-pay

## 2020-02-23 ENCOUNTER — Telehealth (INDEPENDENT_AMBULATORY_CARE_PROVIDER_SITE_OTHER): Payer: Self-pay | Admitting: Physician Assistant

## 2020-02-23 ENCOUNTER — Encounter: Payer: Self-pay | Admitting: Physician Assistant

## 2020-02-23 DIAGNOSIS — R1013 Epigastric pain: Secondary | ICD-10-CM

## 2020-02-23 MED ORDER — PANTOPRAZOLE SODIUM 40 MG PO TBEC: 40.0000 mg | DELAYED_RELEASE_TABLET | Freq: Every day | ORAL | 3 refills | Status: AC

## 2020-02-23 NOTE — Progress Notes (Signed)
I have discussed the procedure for the virtual visit with the patient who has given consent to proceed with assessment and treatment.   Kyndal Heringer S Antavius Sperbeck, CMA     

## 2020-02-23 NOTE — Progress Notes (Signed)
   Virtual Visit via Video   I connected with patient on 02/23/20 at  2:30 PM EST by a video enabled telemedicine application and verified that I am speaking with the correct person using two identifiers.  Location patient: Home Location provider: [facility] Persons participating in the virtual visit: Patient, Provider, CMA (Patina Moore)  I discussed the limitations of evaluation and management by telemedicine and the availability of in person appointments. The patient expressed understanding and agreed to proceed.  Subjective:   HPI:   Patient presents via [facility] today 1.5-2 weeks of intermittent episodes of abdominal pain, LUQ/epigastric, most commonly after a heavy meal. Denies RUQ pain. Denies nausea or vomiting. Increased bloating. Denies melena, hematochezia or tenesmus. Mild heartburn noted with this. Denies change in bladder habits. Denies fever, chills, malaise or fatigue.   ROS:   See pertinent positives and negatives per HPI.  Patient Active Problem List   Diagnosis Date Noted  . Class 2 obesity due to excess calories without serious comorbidity in adult 09/24/2017  . Plantar fasciitis of left foot 09/24/2017  . Allergic rhinitis 07/14/2014  . Need for prophylactic vaccination with combined diphtheria-tetanus-pertussis (DTP) vaccine 10/20/2013  . Visit for preventive health examination 10/20/2013  . GERD (gastroesophageal reflux disease) 10/20/2013    Social History   Tobacco Use  . Smoking status: Never Smoker  . Smokeless tobacco: Never Used  Substance Use Topics  . Alcohol use: No    Alcohol/week: 0.0 standard drinks    Current Outpatient Medications:  .  fluticasone (FLONASE) 50 MCG/ACT nasal spray, SPRAY 2 SPRAYS INTO EACH NOSTRIL EVERY DAY, Disp: 16 mL, Rfl: 0 .  hydrochlorothiazide (HYDRODIURIL) 12.5 MG tablet, TAKE 1 TABLET BY MOUTH EVERY DAY, Disp: 90 tablet, Rfl: 1 .  Multiple Vitamins-Minerals (MULTIVITAMIN MEN) TABS, Take 1  tablet by mouth daily., Disp: 90 tablet, Rfl: 0  No Known Allergies  Objective:   There were no vitals taken for this visit.  Patient is well-developed, well-nourished in no acute distress.  Resting comfortably at home.  Head is normocephalic, atraumatic.  No labored breathing.  Speech is clear and coherent with logical content.  Patient is alert and oriented at baseline.   Assessment and Plan:   1. Epigastric pain With left upper quadrant pain.  Has history of GERD so suspected gastritis.  We will start her on Protonix 40 mg once daily.  GERD diet reviewed.  We will have him come in tomorrow for labs to assess CBC, CMP and lipase to rule out other potential causes of symptoms. - CBC with Differential/Platelet; Future - Comprehensive metabolic panel; Future - Lipase; Future    Piedad Climes, PA-C 02/23/2020

## 2020-02-24 ENCOUNTER — Ambulatory Visit (INDEPENDENT_AMBULATORY_CARE_PROVIDER_SITE_OTHER): Payer: BC Managed Care – PPO

## 2020-02-24 DIAGNOSIS — R1013 Epigastric pain: Secondary | ICD-10-CM

## 2020-02-25 LAB — COMPREHENSIVE METABOLIC PANEL
ALT: 39 U/L (ref 0–53)
AST: 24 U/L (ref 0–37)
Albumin: 4.5 g/dL (ref 3.5–5.2)
Alkaline Phosphatase: 79 U/L (ref 39–117)
BUN: 10 mg/dL (ref 6–23)
CO2: 28 mEq/L (ref 19–32)
Calcium: 9.7 mg/dL (ref 8.4–10.5)
Chloride: 98 mEq/L (ref 96–112)
Creatinine, Ser: 0.83 mg/dL (ref 0.40–1.50)
GFR: 110.96 mL/min (ref >60.00)
Glucose, Bld: 108 mg/dL — ABNORMAL HIGH (ref 70–99)
Potassium: 3.8 mEq/L (ref 3.5–5.1)
Sodium: 137 mEq/L (ref 135–145)
Total Bilirubin: 0.4 mg/dL (ref 0.2–1.2)
Total Protein: 8.3 g/dL (ref 6.0–8.3)

## 2020-02-25 LAB — CBC WITH DIFFERENTIAL/PLATELET
Basophils Absolute: 0 10*3/uL (ref 0.0–0.1)
Basophils Relative: 0.6 % (ref 0.0–3.0)
Eosinophils Absolute: 0 10*3/uL (ref 0.0–0.7)
Eosinophils Relative: 0.5 % (ref 0.0–5.0)
HCT: 48.2 % (ref 39.0–52.0)
Hemoglobin: 15.9 g/dL (ref 13.0–17.0)
Lymphocytes Relative: 31.2 % (ref 12.0–46.0)
Lymphs Abs: 1.9 10*3/uL (ref 0.7–4.0)
MCHC: 32.9 g/dL (ref 30.0–36.0)
MCV: 88.7 fl (ref 78.0–100.0)
Monocytes Absolute: 0.4 10*3/uL (ref 0.1–1.0)
Monocytes Relative: 5.9 % (ref 3.0–12.0)
Neutro Abs: 3.7 10*3/uL (ref 1.4–7.7)
Neutrophils Relative %: 61.8 % (ref 43.0–77.0)
Platelets: 280 10*3/uL (ref 150.0–400.0)
RBC: 5.44 Mil/uL (ref 4.22–5.81)
RDW: 14.2 % (ref 11.5–15.5)
WBC: 6.1 10*3/uL (ref 4.0–10.5)

## 2020-02-25 LAB — LIPASE: Lipase: 24 U/L (ref 11.0–59.0)

## 2020-04-18 ENCOUNTER — Telehealth: Payer: Self-pay | Admitting: Physician Assistant

## 2020-04-18 NOTE — Telephone Encounter (Signed)
FYI:  Patient called to inform you that he has been diagnosed with Covid on Friday - rapid test

## 2020-04-18 NOTE — Telephone Encounter (Signed)
Increase fluids and get plenty of rest. Start Vitamin D 1000 units daily, Vitamin C 1000 mg daily and an OTC zinc supplement. Can use tylenol for fever. If more significant symptoms needs video visit. ER/UC for severe symptoms. Needs to quarantine for 7 days from onset of symptoms if vaccinated.

## 2020-04-18 NOTE — Telephone Encounter (Signed)
FYI

## 2020-04-20 DIAGNOSIS — Z1152 Encounter for screening for COVID-19: Secondary | ICD-10-CM | POA: Diagnosis not present

## 2020-04-20 NOTE — Telephone Encounter (Signed)
Patient states has been doing well. Now having increased nasal congestion. Gave PCP recommendations. He is taking Mucinex for the congestion. Has been quarantine since symptom onset 7 days. He is vaccinated.

## 2020-04-20 NOTE — Telephone Encounter (Signed)
LMOVM for return call for PCP recommendations 

## 2021-01-18 DIAGNOSIS — Z0000 Encounter for general adult medical examination without abnormal findings: Secondary | ICD-10-CM | POA: Diagnosis not present

## 2021-01-18 DIAGNOSIS — I10 Essential (primary) hypertension: Secondary | ICD-10-CM | POA: Diagnosis not present

## 2021-01-18 DIAGNOSIS — Z125 Encounter for screening for malignant neoplasm of prostate: Secondary | ICD-10-CM | POA: Diagnosis not present

## 2021-01-18 DIAGNOSIS — Z13220 Encounter for screening for lipoid disorders: Secondary | ICD-10-CM | POA: Diagnosis not present

## 2021-01-25 DIAGNOSIS — R109 Unspecified abdominal pain: Secondary | ICD-10-CM | POA: Diagnosis not present

## 2021-01-26 ENCOUNTER — Other Ambulatory Visit: Payer: Self-pay | Admitting: Family Medicine

## 2021-01-26 DIAGNOSIS — R109 Unspecified abdominal pain: Secondary | ICD-10-CM

## 2021-02-07 ENCOUNTER — Ambulatory Visit
Admission: RE | Admit: 2021-02-07 | Discharge: 2021-02-07 | Disposition: A | Discharge status: Home / Self Care | Payer: BC Managed Care – PPO | Source: Ambulatory Visit | Attending: Family Medicine | Admitting: Family Medicine

## 2021-02-07 DIAGNOSIS — R109 Unspecified abdominal pain: Secondary | ICD-10-CM | POA: Diagnosis not present

## 2021-02-07 DIAGNOSIS — I700 Atherosclerosis of aorta: Secondary | ICD-10-CM | POA: Diagnosis not present

## 2021-02-07 DIAGNOSIS — K760 Fatty (change of) liver, not elsewhere classified: Secondary | ICD-10-CM | POA: Diagnosis not present

## 2022-01-16 ENCOUNTER — Ambulatory Visit (INDEPENDENT_AMBULATORY_CARE_PROVIDER_SITE_OTHER): Payer: 59 | Admitting: Sports Medicine

## 2022-01-16 VITALS — BP 132/80 | HR 81 | Ht 66.0 in | Wt 270.0 lb

## 2022-01-16 DIAGNOSIS — M25562 Pain in left knee: Secondary | ICD-10-CM | POA: Diagnosis not present

## 2022-01-16 DIAGNOSIS — M722 Plantar fascial fibromatosis: Secondary | ICD-10-CM | POA: Diagnosis not present

## 2022-01-16 MED ORDER — MELOXICAM 15 MG PO TABS: 15.0000 mg | ORAL_TABLET | Freq: Every day | ORAL | 0 refills | Status: AC

## 2022-01-16 NOTE — Patient Instructions (Addendum)
Good to see you  Knee HEP - Start meloxicam 15 mg daily x2 weeks.  If still having pain after 2 weeks, complete 3rd-week of meloxicam. May use remaining meloxicam as needed once daily for pain control.  Do not to use additional NSAIDs while taking meloxicam.  May use Tylenol [phone] mg 2 to 3 times a day for breakthrough pain. Recommend going to [employer] to get assistance with inserts  3-4 week follow up

## 2022-01-16 NOTE — Progress Notes (Signed)
[facility] [address] [place] [address] Phone: [phone]   Assessment and Plan:     1. Acute pain of left knee -Acute, uncomplicated, initial sports medicine visit - Unclear etiology, though most consistent with small degree tear of the medial meniscus versus grade 1 MCL sprain based on HPI and physical exam - With patient generally improving, we will start with conservative therapy - Start meloxicam 15 mg daily x2 weeks.  If still having pain after 2 weeks, complete 3rd-week of meloxicam. May use remaining meloxicam as needed once daily for pain control.  Do not to use additional NSAIDs while taking meloxicam.  May use Tylenol [phone] mg 2 to 3 times a day for breakthrough pain. - Start HEP for knee - Patient interested in running a 5K in December.  Recommended following a couch to 5K program online to prevent injury  2. Plantar fasciitis -Chronic with exacerbation, initial sports medicine visit - Mildly improving after patient changed footwear to a new pair of [employer] - Recommend continuing to use supportive footwear when working and when exercising - Recommend getting cushioned inserts to decrease flares of Planter fasciitis.  Store such as [employer] may be helpful in picking the correct insert - Start meloxicam 15 mg daily x2 weeks.  If still having pain after 2 weeks, complete 3rd-week of meloxicam. May use remaining meloxicam as needed once daily for pain control.  Do not to use additional NSAIDs while taking meloxicam.  May use Tylenol [phone] mg 2 to 3 times a day for breakthrough pain. - Start HEP  Other orders - meloxicam (MOBIC) 15 MG tablet; Take 1 tablet (15 mg total) by mouth daily.    Pertinent previous records reviewed include none   Follow Up: 3 to 4 weeks for reevaluation.  If no improvement or worsening of symptoms, could x-ray painful areas and could consider CSI versus physical therapy    Subjective:   I, Glenn Butler, am serving as a Neurosurgeon for Doctor [name]  Chief Complaint: left knee pain   HPI:   01/16/22 Patient is a 41 year old male complaining of left knee pain. Patient states that 2 months ago he was cutting grass and he tweaked his knee, hx of plantar fascitis , past week going up and down steps hurts , has to warm up before he can do anything, today knee feels good, no numbness tingling, just pressure and stiffness, no meds for the pain, little pop depends on the angel his knee is in painful sometimes , medial knee pain no radiating pain   Relevant Historical Information: GERD  Additional pertinent review of systems negative.   Current Outpatient Medications:    fluticasone (FLONASE) 50 MCG/ACT nasal spray, SPRAY 2 SPRAYS INTO EACH NOSTRIL EVERY DAY, Disp: 16 mL, Rfl: 0   meloxicam (MOBIC) 15 MG tablet, Take 1 tablet (15 mg total) by mouth daily., Disp: 30 tablet, Rfl: 0   Multiple Vitamins-Minerals (MULTIVITAMIN MEN) TABS, Take 1 tablet by mouth daily., Disp: 90 tablet, Rfl: 0   pantoprazole (PROTONIX) 40 MG tablet, Take 1 tablet (40 mg total) by mouth daily., Disp: 30 tablet, Rfl: 3   Objective:     Vitals:   01/16/22 1411  BP: 132/80  Pulse: 81  SpO2: 97%  Weight: 270 lb (122.5 kg)  Height: 5\' 6"  (1.676 m)      Body mass index is 43.58 kg/m.    Physical Exam:  General:  awake, alert oriented, no acute distress nontoxic Skin: no suspicious lesions or rashes Neuro:sensation intact, no deficits, strength 5/5 with no deficits, no atrophy, normal muscle tone Psych: No signs of anxiety, depression or other mood disorder  Left knee: Crepitus present No swelling No deformity Neg fluid wave, joint milking ROM Flex 110 , Ext 0  TTP pes anserine bursa, medial femoral condyle NTTP over the quad tendon,  lat fem condyle, patella, plica, patella tendon, tibial tuberostiy, fibular head, posterior fossa,  gerdy's tubercle, medial jt line,  lateral jt line Neg anterior and posterior drawer Neg lachman Neg sag sign Negative varus stress Positive valgus stress for pain, negative for laxity Negative McMurray, though produced medial knee pain without popping Negative Thessaly  Gait normal    Electronically signed by:  Benito Mccreedy D.[facility] 2:46 PM 01/16/22

## 2022-02-05 NOTE — Progress Notes (Unsigned)
    Glenn Butler D.[place] Phone: [phone]   Assessment and Plan:     There are no diagnoses linked to this encounter.  ***   Pertinent previous records reviewed include ***   Follow Up: ***     Subjective:   I, Glenn Butler, am serving as a Education administrator for Glenn Butler   Chief Complaint: left knee pain    HPI:    01/16/22 Patient is a 41 year old male complaining of left knee pain. Patient states that 2 months ago he was cutting grass and he tweaked his knee, hx of plantar fascitis , past week going up and down steps hurts , has to warm up before he can do anything, today knee feels good, no numbness tingling, just pressure and stiffness, no meds for the pain, little pop depends on the angle his knee is in painful sometimes , medial knee pain no radiating pain   02/06/2022 Patient states    Relevant Historical Information: GERD  Additional pertinent review of systems negative.   Current Outpatient Medications:    fluticasone (FLONASE) 50 MCG/ACT nasal spray, SPRAY 2 SPRAYS INTO EACH NOSTRIL EVERY DAY, Disp: 16 mL, Rfl: 0   meloxicam (MOBIC) 15 MG tablet, Take 1 tablet (15 mg total) by mouth daily., Disp: 30 tablet, Rfl: 0   Multiple Vitamins-Minerals (MULTIVITAMIN MEN) TABS, Take 1 tablet by mouth daily., Disp: 90 tablet, Rfl: 0   pantoprazole (PROTONIX) 40 MG tablet, Take 1 tablet (40 mg total) by mouth daily., Disp: 30 tablet, Rfl: 3   Objective:     There were no vitals filed for this visit.    There is no height or weight on file to calculate BMI.    Physical Exam:    ***   Electronically signed by:  Glenn Butler D.[facility] 11:41 AM 02/05/22

## 2022-02-06 ENCOUNTER — Ambulatory Visit (INDEPENDENT_AMBULATORY_CARE_PROVIDER_SITE_OTHER): Payer: 59 | Admitting: Sports Medicine

## 2022-02-06 VITALS — BP 138/82 | HR 82 | Ht 66.0 in | Wt 270.0 lb

## 2022-02-06 DIAGNOSIS — M25562 Pain in left knee: Secondary | ICD-10-CM | POA: Diagnosis not present

## 2022-02-06 DIAGNOSIS — M722 Plantar fascial fibromatosis: Secondary | ICD-10-CM | POA: Diagnosis not present

## 2022-02-06 NOTE — Patient Instructions (Signed)
Good to see you   

## 2022-03-05 NOTE — Progress Notes (Unsigned)
    Glenn Butler D.[facility] [address] [place] [address] Phone: [phone]   Assessment and Plan:     There are no diagnoses linked to this encounter.  ***   Pertinent previous records reviewed include ***   Follow Up: ***     Subjective:   I, Glenn Butler, am serving as a Neurosurgeon for Glenn Butler   Chief Complaint: left knee pain    HPI:    01/16/22 Patient is a 41 year old male complaining of left knee pain. Patient states that 2 months ago he was cutting grass and he tweaked his knee, hx of plantar fascitis , past week going up and down steps hurts , has to warm up before he can do anything, today knee feels good, no numbness tingling, just pressure and stiffness, no meds for the pain, little pop depends on the angle his knee is in painful sometimes , medial knee pain no radiating pain    02/06/2022 Patient states that he is still tender    03/06/2022 Patient states    Relevant Historical Information: GERD  Additional pertinent review of systems negative.   Current Outpatient Medications:    fluticasone (FLONASE) 50 MCG/ACT nasal spray, SPRAY 2 SPRAYS INTO EACH NOSTRIL EVERY DAY, Disp: 16 mL, Rfl: 0   meloxicam (MOBIC) 15 MG tablet, Take 1 tablet (15 mg total) by mouth daily., Disp: 30 tablet, Rfl: 0   Multiple Vitamins-Minerals (MULTIVITAMIN MEN) TABS, Take 1 tablet by mouth daily., Disp: 90 tablet, Rfl: 0   pantoprazole (PROTONIX) 40 MG tablet, Take 1 tablet (40 mg total) by mouth daily., Disp: 30 tablet, Rfl: 3   Objective:     There were no vitals filed for this visit.    There is no height or weight on file to calculate BMI.    Physical Exam:    ***   Electronically signed by:  Glenn Butler D.[facility] 12:24 PM 03/05/22

## 2022-03-06 ENCOUNTER — Ambulatory Visit (INDEPENDENT_AMBULATORY_CARE_PROVIDER_SITE_OTHER): Payer: 59 | Admitting: Sports Medicine

## 2022-03-06 ENCOUNTER — Ambulatory Visit (INDEPENDENT_AMBULATORY_CARE_PROVIDER_SITE_OTHER): Payer: 59

## 2022-03-06 VITALS — HR 83 | Ht 66.0 in | Wt 271.0 lb

## 2022-03-06 DIAGNOSIS — M25562 Pain in left knee: Secondary | ICD-10-CM | POA: Diagnosis not present

## 2022-03-06 NOTE — Patient Instructions (Addendum)
Good to see you ?Knee HEP  ?3 week follow up  ?

## 2022-03-26 NOTE — Progress Notes (Deleted)
[name] [name] [address] [place] [address] Phone: [phone]   Assessment and Plan:     There are no diagnoses linked to this encounter.  ***   Pertinent previous records reviewed include ***   Follow Up: ***     Subjective:   I, Marja Adderley, am serving as a Neurosurgeon for Doctor [name]   Chief Complaint: left knee pain    HPI:    01/16/22 Patient is a 41 year old male complaining of left knee pain. Patient states that 2 months ago he was cutting grass and he tweaked his knee, hx of plantar fascitis , past week going up and down steps hurts , has to warm up before he can do anything, today knee feels good, no numbness tingling, just pressure and stiffness, no meds for the pain, little pop depends on the angle his knee is in painful sometimes , medial knee pain no radiating pain    02/06/2022 Patient states that he is still tender    03/06/2022 Patient states he is down bad wasn't able to walk for 3 days . He was working and lifted a box and tweaked his knee, 2 weeks ago, today is the best he has felt     03/27/2022 Patient states   Relevant Historical Information: GERD  Additional pertinent review of systems negative.   Current Outpatient Medications:    fluticasone (FLONASE) 50 MCG/ACT nasal spray, SPRAY 2 SPRAYS INTO EACH NOSTRIL EVERY DAY, Disp: 16 mL, Rfl: 0   meloxicam (MOBIC) 15 MG tablet, Take 1 tablet (15 mg total) by mouth daily., Disp: 30 tablet, Rfl: 0   Multiple Vitamins-Minerals (MULTIVITAMIN MEN) TABS, Take 1 tablet by mouth daily., Disp: 90 tablet, Rfl: 0   pantoprazole (PROTONIX) 40 MG tablet, Take 1 tablet (40 mg total) by mouth daily., Disp: 30 tablet, Rfl: 3   Objective:     There were no vitals filed for this visit.    There is no height or weight on file to calculate BMI.    Physical Exam:    ***   Electronically signed by:  [name] [name] 12:27 PM 03/26/22

## 2022-03-27 ENCOUNTER — Ambulatory Visit: Payer: 59 | Admitting: Sports Medicine

## 2022-11-26 IMAGING — US US ABDOMEN COMPLETE
1 series · 14 of 25 positions shown · non-contrast
Comparison: None.

CLINICAL DATA: Unspecified abdominal pain.

EXAM:
ABDOMEN ULTRASOUND COMPLETE

[Series 1: us abdomen complete · 0.23mm/px · 14 of 134 slices shown]
[im 1/134]
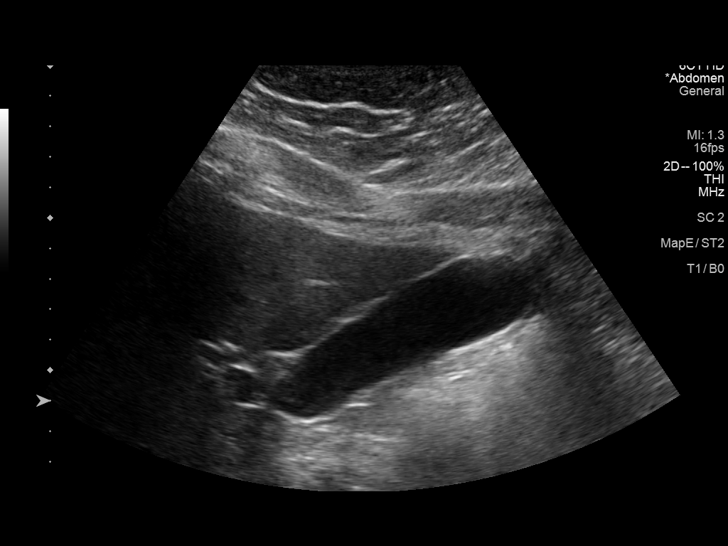
[im 12/134]
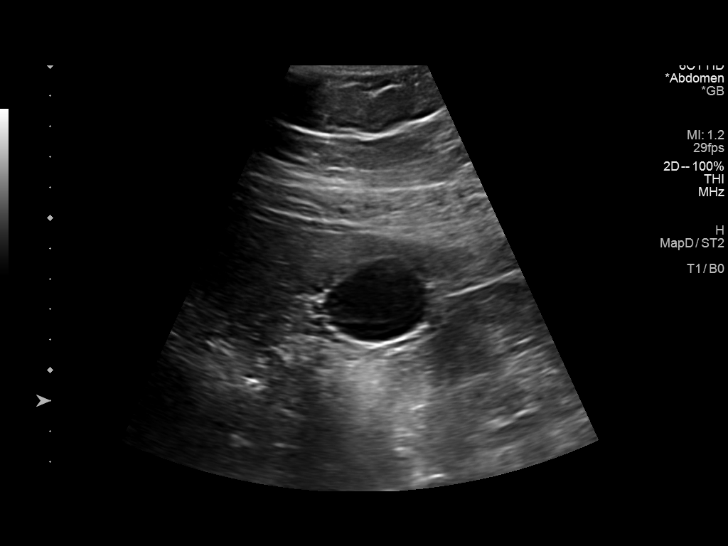
[im 23/134]
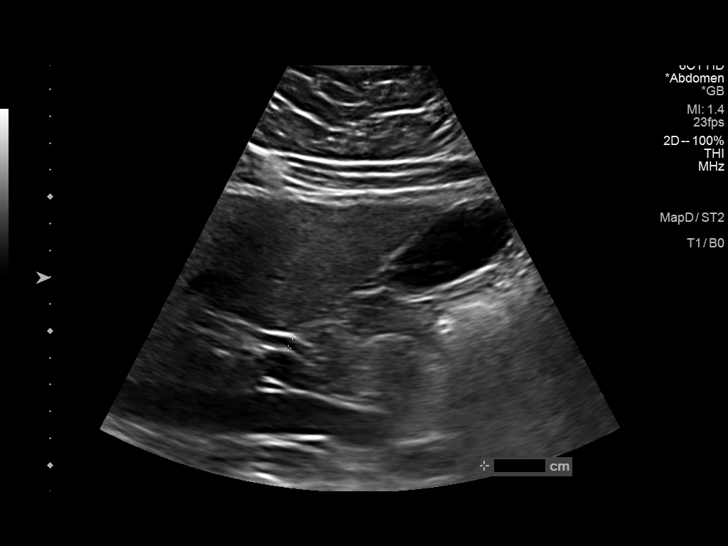
[im 34/134]
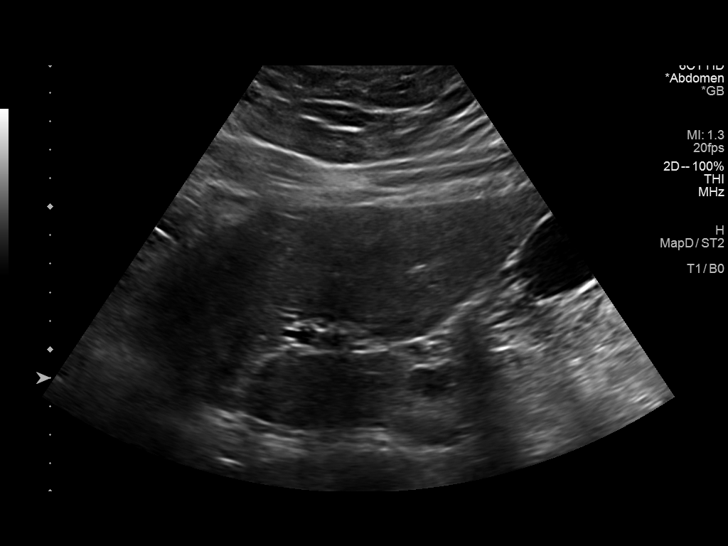
[im 45/134]
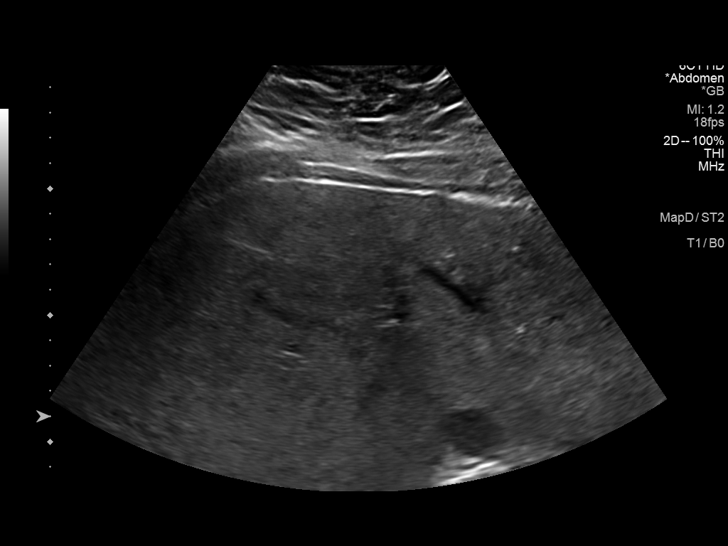
[im 50/134]
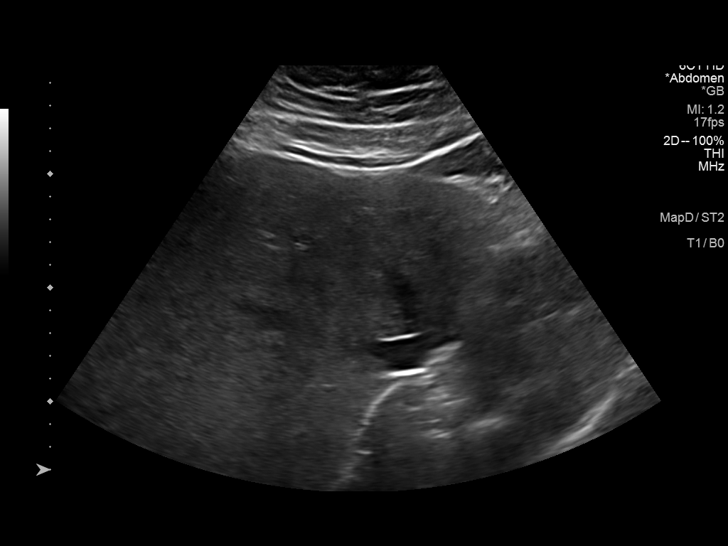
[im 61/134]
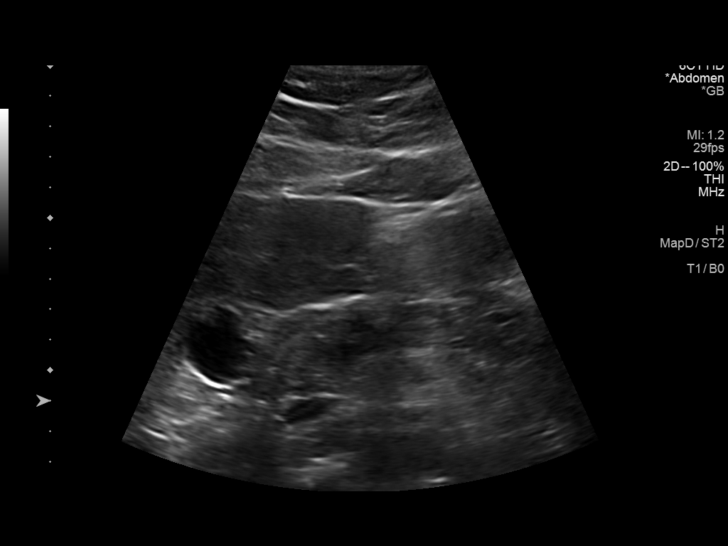
[im 73/134]
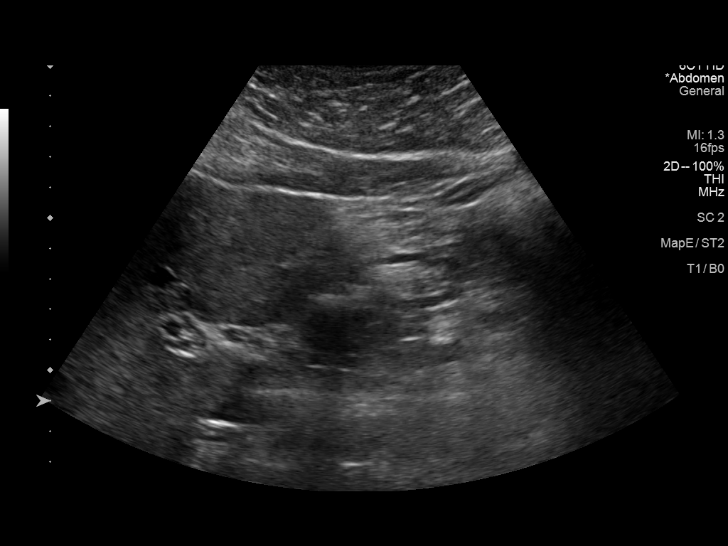
[im 84/134]
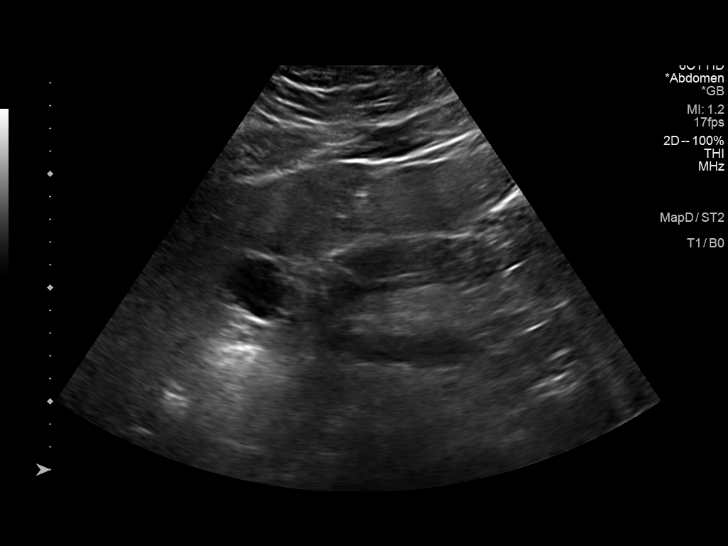
[im 89/134]
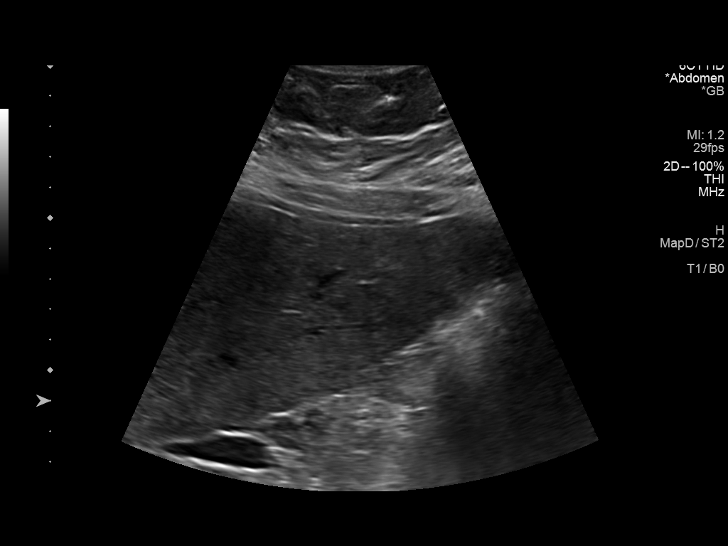
[im 100/134]
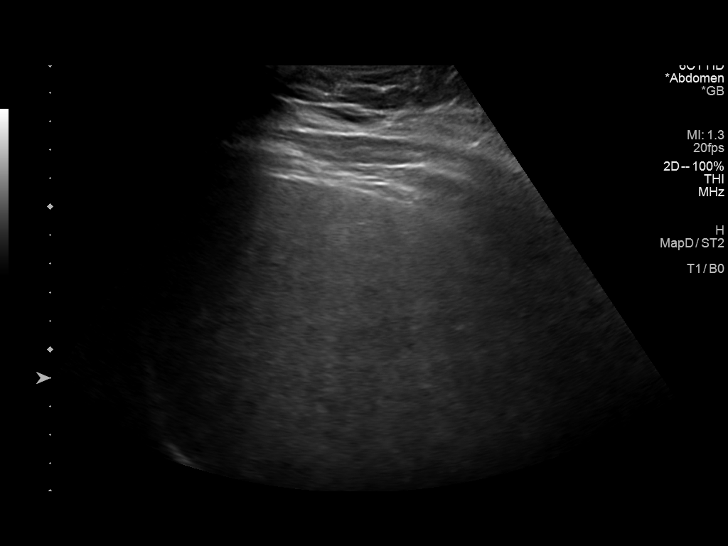
[im 111/134]
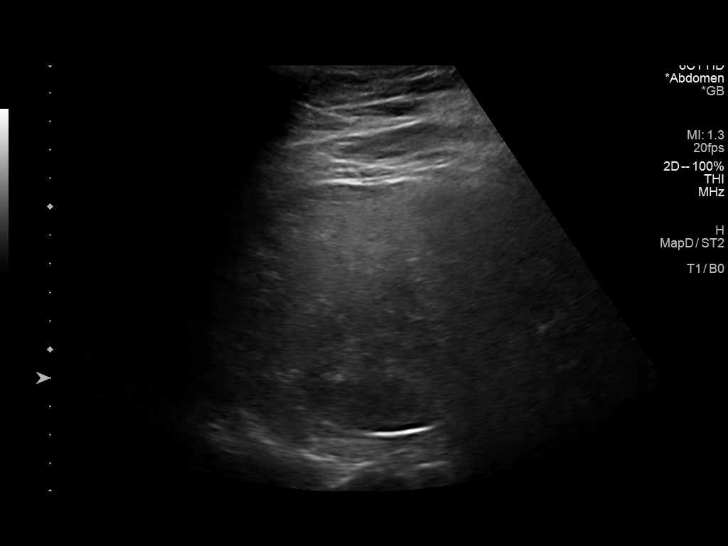
[im 122/134]
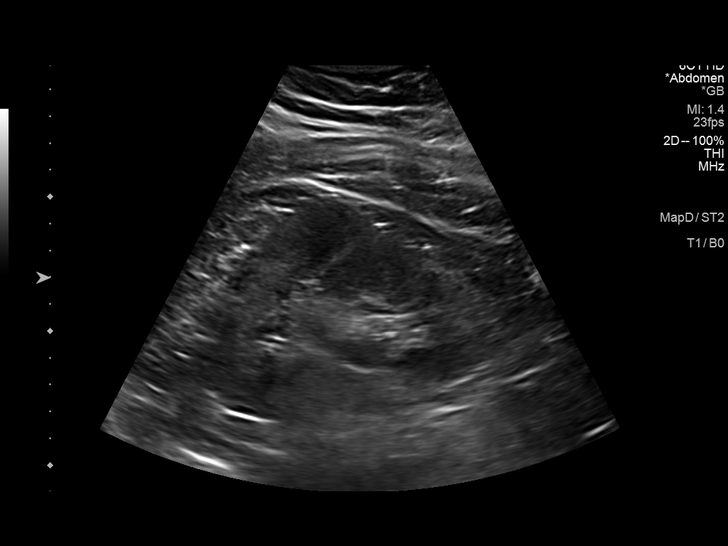
[im 134/134]
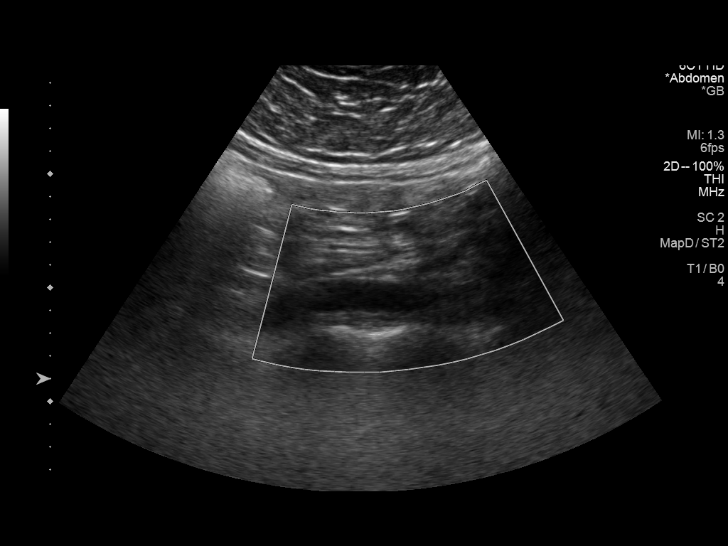

[14 of 25 positions shown; findings below may reference images not displayed]

FINDINGS: Gallbladder: No gallstones or wall thickening visualized. No
sonographic Murphy sign noted by sonographer.

Common bile duct: Diameter: 5 mm.

Liver: No focal lesion identified. Increased parenchymal
echogenicity. Portal vein is patent on color Doppler imaging with
normal direction of blood flow towards the liver.

IVC: No abnormality visualized.

Pancreas: Visualized portion unremarkable.

Spleen: Size and appearance within normal limits.

Right Kidney: Length: 11.8 cm. Echogenicity within normal limits. No
mass or hydronephrosis visualized.

Left Kidney: Length: 12.4 cm. Echogenicity within normal limits. No
mass or hydronephrosis visualized.

Abdominal aorta: No aneurysm visualized.  Atherosclerotic plaque.

Other findings: None.
IMPRESSION: 1. Hepatic steatosis Please note limited evaluation for focal
hepatic masses in a patient with hepatic steatosis due to decreased
penetration of the acoustic ultrasound waves.
2.  Aortic Atherosclerosis ([id number].M).

## 2023-02-11 NOTE — Progress Notes (Unsigned)
    Glenn [facility] [name] [address] [place] [address] Phone: [phone]   Assessment and Plan:     There are no diagnoses linked to this encounter.  ***   Pertinent previous records reviewed include ***   Follow Up: ***     Subjective:   I, Glenn Butler, am serving as a Neurosurgeon for Doctor [name]   Chief Complaint: left knee pain    HPI:    01/16/22 Patient is a 42 year old male complaining of left knee pain. Patient states that 2 months ago he was cutting grass and he tweaked his knee, hx of plantar fascitis , past week going up and down steps hurts , has to warm up before he can do anything, today knee feels good, no numbness tingling, just pressure and stiffness, no meds for the pain, little pop depends on the angle his knee is in painful sometimes , medial knee pain no radiating pain    02/06/2022 Patient states that he is still tender    03/06/2022 Patient states he is down bad wasn't able to walk for 3 days . He was working and lifted a box and tweaked his knee, 2 weeks ago, today is the best he has felt   02/12/2023 Patient states     Relevant Historical Information: GERD   Additional pertinent review of systems negative.   Current Outpatient Medications:    fluticasone (FLONASE) 50 MCG/ACT nasal spray, SPRAY 2 SPRAYS INTO EACH NOSTRIL EVERY DAY, Disp: 16 mL, Rfl: 0   meloxicam (MOBIC) 15 MG tablet, Take 1 tablet (15 mg total) by mouth daily., Disp: 30 tablet, Rfl: 0   Multiple Vitamins-Minerals (MULTIVITAMIN MEN) TABS, Take 1 tablet by mouth daily., Disp: 90 tablet, Rfl: 0   pantoprazole (PROTONIX) 40 MG tablet, Take 1 tablet (40 mg total) by mouth daily., Disp: 30 tablet, Rfl: 3   Objective:     There were no vitals filed for this visit.    There is no height or weight on file to calculate BMI.    Physical Exam:    ***   Electronically signed by:  Glenn [name] 4:03 PM 02/11/23

## 2023-02-12 ENCOUNTER — Ambulatory Visit (INDEPENDENT_AMBULATORY_CARE_PROVIDER_SITE_OTHER): Payer: 59 | Admitting: Sports Medicine

## 2023-02-12 VITALS — BP 138/80 | HR 88 | Ht 66.0 in | Wt 278.0 lb

## 2023-02-12 DIAGNOSIS — M722 Plantar fascial fibromatosis: Secondary | ICD-10-CM

## 2023-02-12 DIAGNOSIS — G8929 Other chronic pain: Secondary | ICD-10-CM | POA: Diagnosis not present

## 2023-02-12 DIAGNOSIS — M25562 Pain in left knee: Secondary | ICD-10-CM

## 2023-02-12 MED ORDER — MELOXICAM 15 MG PO TABS: 15.0000 mg | ORAL_TABLET | Freq: Every day | ORAL | 0 refills | Status: AC

## 2023-02-12 NOTE — Patient Instructions (Signed)
-   Start meloxicam 15 mg daily x2 weeks.  If still having pain after 2 weeks, complete 3rd-week of meloxicam. May use remaining meloxicam as needed once daily for pain control.  Do not to use additional NSAIDs while taking meloxicam.  May use Tylenol [phone] mg 2 to 3 times a day for breakthrough pain. Knee and plantar HEP  Recommend going to [employer] or similar store to get inserts for shoes 4 week follow up

## 2023-03-05 ENCOUNTER — Other Ambulatory Visit: Payer: Self-pay | Admitting: Sports Medicine

## 2023-03-11 NOTE — Progress Notes (Unsigned)
    [facility] [address] [place] [address] Phone: [phone]   Assessment and Plan:     There are no diagnoses linked to this encounter.  ***   Pertinent previous records reviewed include ***    Follow Up: ***     Subjective:   I, Glenn Butler, am serving as a Neurosurgeon for Doctor [name]   Chief Complaint: left knee pain    HPI:    01/16/22 Patient is a 42 year old male complaining of left knee pain. Patient states that 2 months ago he was cutting grass and he tweaked his knee, hx of plantar fascitis , past week going up and down steps hurts , has to warm up before he can do anything, today knee feels good, no numbness tingling, just pressure and stiffness, no meds for the pain, little pop depends on the angle his knee is in painful sometimes , medial knee pain no radiating pain    02/06/2022 Patient states that he is still tender    03/06/2022 Patient states he is down bad wasn't able to walk for 3 days . He was working and lifted a box and tweaked his knee, 2 weeks ago, today is the best he has felt    02/12/2023 Patient states that knee pain has returned    03/12/2023 Patient states  Relevant Historical Information: GERD  Additional pertinent review of systems negative.   Current Outpatient Medications:    fluticasone (FLONASE) 50 MCG/ACT nasal spray, SPRAY 2 SPRAYS INTO EACH NOSTRIL EVERY DAY, Disp: 16 mL, Rfl: 0   meloxicam (MOBIC) 15 MG tablet, Take 1 tablet (15 mg total) by mouth daily., Disp: 30 tablet, Rfl: 0   meloxicam (MOBIC) 15 MG tablet, Take 1 tablet (15 mg total) by mouth daily., Disp: 30 tablet, Rfl: 0   Multiple Vitamins-Minerals (MULTIVITAMIN MEN) TABS, Take 1 tablet by mouth daily., Disp: 90 tablet, Rfl: 0   pantoprazole (PROTONIX) 40 MG tablet, Take 1 tablet (40 mg total) by mouth daily., Disp: 30 tablet, Rfl: 3   Objective:     There were no vitals filed for this visit.     There is no height or weight on file to calculate BMI.    Physical Exam:    ***   Electronically signed by:  [facility] 8:39 AM 03/11/23

## 2023-03-12 ENCOUNTER — Ambulatory Visit: Payer: 59 | Admitting: Sports Medicine

## 2023-09-19 NOTE — Progress Notes (Signed)
 Glenn [facility] D.[facility] [address] [place] [address] Phone: [phone]   Assessment and Plan:     1. Chronic pain of left knee -Chronic with exacerbation, subsequent visit - Recurrence of left knee pain consistent with overuse and aggravation from steel toed boots - Patient has had significant improvement in the past with meloxicam  course and elected to repeat meloxicam  course today.  Last course completed in December 2024 - Start meloxicam  15 mg daily x2 weeks.  If still having pain after 2 weeks, complete 3rd-week of NSAID. May use remaining NSAID as needed once daily for pain control.  Do not to use additional over-the-counter NSAIDs (ibuprofen , naproxen , Advil , Aleve , etc.) while taking prescription NSAIDs.  May use Tylenol [phone] mg 2 to 3 times a day for breakthrough pain. -Restart HEP for knee -Recommend only wearing steel toed boots when necessary.  Recommend bringing comfortable tennis shoes to work to use when allowed  2. Plantar fasciitis of left foot 3. Plantar fasciitis, right -Chronic with exacerbation, subsequent visit - Most consistent with bilateral plantar fasciitis, likely aggravated by steel toed boots - Start meloxicam  15 mg daily x2 weeks.  If still having pain after 2 weeks, complete 3rd-week of NSAID. May use remaining NSAID as needed once daily for pain control.  Do not to use additional over-the-counter NSAIDs (ibuprofen , naproxen , Advil , Aleve , etc.) while taking prescription NSAIDs.  May use Tylenol [phone] mg 2 to 3 times a day for breakthrough pain. - Restart HEP for bilateral feet -Recommend only wearing steel toed boots when necessary.  Recommend bringing comfortable tennis shoes to work to use when allowed     Pertinent previous records reviewed include none  Follow Up: 6 to 8 weeks for reevaluation.  If no improvement or worsening of symptoms, could consider advanced imaging versus CSI   Subjective:    I, Glenn Butler am a scribe for Dr. Cleora Daft.   Chief Complaint: Left knee pain   HPI:   01/16/22 Patient is a 43 year old male complaining of left knee pain. Patient states that 2 months ago he was cutting grass and he tweaked his knee, hx of plantar fascitis , past week going up and down steps hurts , has to warm up before he can do anything, today knee feels good, no numbness tingling, just pressure and stiffness, no meds for the pain, little pop depends on the angle his knee is in painful sometimes , medial knee pain no radiating pain    02/06/2022 Patient states that he is still tender    03/06/2022 Patient states he is down bad wasn't able to walk for 3 days . He was working and lifted a box and tweaked his knee, 2 weeks ago, today is the best he has felt    02/12/2023 Patient states that knee pain has returned   09/20/23 Last visit patient started Meloxicam  15 mg daily and given HEP. Patient states  that his left knee has had constant pain. Uses pain off medication to treat it. It isn't bad in the morning but if he is sitting for a while and then gets up it hurts. Would like to get it x-ray on the knee to make sure that nothing has changed with it. His feet are giving him some issues. The steel toed shoes for work during the week irritate his feet.   Relevant Historical Information: GERD  Additional pertinent review of systems negative.   Current Outpatient Medications:  meloxicam  (MOBIC ) 15 MG tablet, Take 1 tablet (15 mg total) by mouth daily., Disp: 30 tablet, Rfl: 1   fluticasone  (FLONASE ) 50 MCG/ACT nasal spray, SPRAY 2 SPRAYS INTO EACH NOSTRIL EVERY DAY, Disp: 16 mL, Rfl: 0   meloxicam  (MOBIC ) 15 MG tablet, Take 1 tablet (15 mg total) by mouth daily., Disp: 30 tablet, Rfl: 0   meloxicam  (MOBIC ) 15 MG tablet, Take 1 tablet (15 mg total) by mouth daily., Disp: 30 tablet, Rfl: 0   Multiple Vitamins-Minerals (MULTIVITAMIN MEN) TABS, Take 1 tablet by mouth daily., Disp: 90  tablet, Rfl: 0   pantoprazole  (PROTONIX ) 40 MG tablet, Take 1 tablet (40 mg total) by mouth daily., Disp: 30 tablet, Rfl: 3   Objective:     Vitals:   09/20/23 1129  BP: (!) 150/70  Pulse: 81  SpO2: 98%  Weight: 271 lb (122.9 kg)  Height: 5' 6 (1.676 m)      Body mass index is 43.74 kg/m.    Physical Exam:    General:  awake, alert oriented, no acute distress nontoxic Skin: no suspicious lesions or rashes Neuro:sensation intact, no deficits, strength 5/5 with no deficits, no atrophy, normal muscle tone Psych: No signs of anxiety, depression or other mood disorder   Left leg/knee: Crepitus present No swelling No deformity Neg fluid wave, joint milking ROM Flex 110 , Ext 0  TTP mildly pes anserine bursa, medial femoral condyle, proximal plantar fascia NTTP over the quad tendon,  lat fem condyle, patella, plica, patella tendon, tibial tuberostiy, fibular head, posterior fossa,  gerdy's tubercle, medial jt line, lateral jt line Neg anterior and posterior drawer Neg lachman Neg sag sign Negative varus stress Negative valgus stress Negative McMurray, though produced medial knee pain without popping Negative Thessaly   Gait normal      Electronically signed by:  Marshall Skeeter D.[facility] 11:56 AM 09/20/23

## 2023-09-20 ENCOUNTER — Ambulatory Visit: Admitting: Sports Medicine

## 2023-09-20 VITALS — BP 150/70 | HR 81 | Ht 66.0 in | Wt 271.0 lb

## 2023-09-20 DIAGNOSIS — M25562 Pain in left knee: Secondary | ICD-10-CM | POA: Diagnosis not present

## 2023-09-20 DIAGNOSIS — G8929 Other chronic pain: Secondary | ICD-10-CM

## 2023-09-20 DIAGNOSIS — M722 Plantar fascial fibromatosis: Secondary | ICD-10-CM | POA: Diagnosis not present

## 2023-09-20 MED ORDER — MELOXICAM 15 MG PO TABS: 15.0000 mg | ORAL_TABLET | Freq: Every day | ORAL | 1 refills | Status: AC

## 2023-09-20 NOTE — Patient Instructions (Addendum)
-   Start meloxicam  15 mg daily x2 weeks.  If still having pain after 2 weeks, complete 3rd-week of NSAID. May use remaining NSAID as needed once daily for pain control.  Do not to use additional over-the-counter NSAIDs (ibuprofen , naproxen , Advil , Aleve , etc.) while taking prescription NSAIDs.  May use Tylenol [phone] 2 to 3 times a day for breakthrough pain.  After finishing 2 week course of meloxicam , no more than 2 doses per week.  Additional print out of knee and foot exercises.  Follow up in 6 to 8 weeks.

## 2023-11-06 NOTE — Progress Notes (Deleted)
    Ben Jackson D[name] [address] [place] [address] Phone: [phone]   Assessment and Plan:     There are no diagnoses linked to this encounter.  ***   Pertinent previous records reviewed include ***    Follow Up: ***     Subjective:   I, Kynadie Yaun, am serving as a Neurosurgeon for Doctor Morene Mace  Chief Complaint: Left knee pain    HPI:    01/16/22 Patient is a 43 year old male complaining of left knee pain. Patient states that 2 months ago he was cutting grass and he tweaked his knee, hx of plantar fascitis , past week going up and down steps hurts , has to warm up before he can do anything, today knee feels good, no numbness tingling, just pressure and stiffness, no meds for the pain, little pop depends on the angle his knee is in painful sometimes , medial knee pain no radiating pain    02/06/2022 Patient states that he is still tender    03/06/2022 Patient states he is down bad wasn't able to walk for 3 days . He was working and lifted a box and tweaked his knee, 2 weeks ago, today is the best he has felt    02/12/2023 Patient states that knee pain has returned    09/20/23 Last visit patient started Meloxicam  15 mg daily and given HEP. Patient states  that his left knee has had constant pain. Uses pain off medication to treat it. It isn't bad in the morning but if he is sitting for a while and then gets up it hurts. Would like to get it x-ray on the knee to make sure that nothing has changed with it. His feet are giving him some issues. The steel toed shoes for work during the week irritate his feet.   11/07/2023 Patient states   Relevant Historical Information: GERD  Additional pertinent review of systems negative.   Current Outpatient Medications:    fluticasone  (FLONASE ) 50 MCG/ACT nasal spray, SPRAY 2 SPRAYS INTO EACH NOSTRIL EVERY DAY, Disp: 16 mL, Rfl: 0   meloxicam  (MOBIC ) 15 MG tablet, Take 1  tablet (15 mg total) by mouth daily., Disp: 30 tablet, Rfl: 0   meloxicam  (MOBIC ) 15 MG tablet, Take 1 tablet (15 mg total) by mouth daily., Disp: 30 tablet, Rfl: 0   meloxicam  (MOBIC ) 15 MG tablet, Take 1 tablet (15 mg total) by mouth daily., Disp: 30 tablet, Rfl: 1   Multiple Vitamins-Minerals (MULTIVITAMIN MEN) TABS, Take 1 tablet by mouth daily., Disp: 90 tablet, Rfl: 0   pantoprazole  (PROTONIX ) 40 MG tablet, Take 1 tablet (40 mg total) by mouth daily., Disp: 30 tablet, Rfl: 3   Objective:     There were no vitals filed for this visit.    There is no height or weight on file to calculate BMI.    Physical Exam:    ***   Electronically signed by:  Odis Mace [name] 7:42 AM 11/06/23

## 2023-11-07 ENCOUNTER — Ambulatory Visit: Admitting: Sports Medicine

## 2023-11-20 NOTE — Progress Notes (Signed)
 [name] [address] [place] [address] Phone: [phone]   Assessment and Plan:     1. Chronic pain of left knee - Chronic with exacerbation, subsequent visit - Consistent with recurrent flare of knee pain.  No significant degenerative changes seen on prior x-ray, so suspect overuse with physical activity at work flaring joint versus mild meniscal pathology - Patient has had significant relief in the past with intra-articular CSI and elects for repeat intra-articular CSI today.  Tolerated well per note below - Continue HEP for knee - Use meloxicam  15 mg daily as needed for pain.  Recommend limiting chronic NSAIDs to 1-2 doses per week to prevent long-term side effects. - Use Tylenol 500 to 1000 mg tablets 2-3 times a day for day-to-day pain relief  Procedure: Knee Joint Injection Side: Left Indication: Flare of left knee pain  Risks explained and consent was given verbally. The site was cleaned with alcohol prep. A needle was introduced with an anterio-lateral approach. Injection given using 2mL of 1% lidocaine without epinephrine and 1mL of kenalog  40mg /ml. This was well tolerated.  Needle was removed, hemostasis achieved, and post injection instructions were explained.   Pt was advised to call or return to clinic if these symptoms worsen or fail to improve as anticipated.    Pertinent previous records reviewed include none  Follow Up: As needed if no improvement or worsening of symptoms.  If >3 months relief, could repeat CSI.  If <3 months relief, could consider Zilretta  versus HA injection versus PRP injection versus advanced imaging versus physical therapy versus NSAID course   Subjective:   I, Glenn Butler, am serving as a Neurosurgeon for Glenn Butler  Chief Complaint: Left knee pain    HPI:    01/16/22 Patient is a 43 year old male complaining of left knee pain. Patient states that 2 months ago he was  cutting grass and he tweaked his knee, hx of plantar fascitis , past week going up and down steps hurts , has to warm up before he can do anything, today knee feels good, no numbness tingling, just pressure and stiffness, no meds for the pain, little pop depends on the angle his knee is in painful sometimes , medial knee pain no radiating pain    02/06/2022 Patient states that he is still tender    03/06/2022 Patient states he is down bad wasn't able to walk for 3 days . He was working and lifted a box and tweaked his knee, 2 weeks ago, today is the best he has felt    02/12/2023 Patient states that knee pain has returned    09/20/23 Last visit patient started Meloxicam  15 mg daily and given HEP. Patient states  that his left knee has had constant pain. Uses pain off medication to treat it. It isn't bad in the morning but if he is sitting for a while and then gets up it hurts. Would like to get it x-ray on the knee to make sure that nothing has changed with it. His feet are giving him some issues. The steel toed shoes for work during the week irritate his feet.   11/21/2023 Patient states pain flare ready for CSI   Relevant Historical Information: GERD  Additional pertinent review of systems negative.   Current Outpatient Medications:    fluticasone  (FLONASE ) 50 MCG/ACT nasal spray, SPRAY 2 SPRAYS INTO EACH NOSTRIL EVERY DAY, Disp: 16 mL, Rfl: 0  meloxicam  (MOBIC ) 15 MG tablet, Take 1 tablet (15 mg total) by mouth daily., Disp: 30 tablet, Rfl: 0   meloxicam  (MOBIC ) 15 MG tablet, Take 1 tablet (15 mg total) by mouth daily., Disp: 30 tablet, Rfl: 0   meloxicam  (MOBIC ) 15 MG tablet, Take 1 tablet (15 mg total) by mouth daily., Disp: 30 tablet, Rfl: 1   Multiple Vitamins-Minerals (MULTIVITAMIN MEN) TABS, Take 1 tablet by mouth daily., Disp: 90 tablet, Rfl: 0   pantoprazole  (PROTONIX ) 40 MG tablet, Take 1 tablet (40 mg total) by mouth daily., Disp: 30 tablet, Rfl: 3   Objective:     Vitals:    11/21/23 1404  BP: 132/80  Pulse: 78  SpO2: 98%  Weight: 261 lb (118.4 kg)  Height: 5' 6 (1.676 m)      Body mass index is 42.13 kg/m.    Physical Exam:    General:  awake, alert oriented, no acute distress nontoxic Skin: no suspicious lesions or rashes Neuro:sensation intact, no deficits, strength 5/5 with no deficits, no atrophy, normal muscle tone Psych: No signs of anxiety, depression or other mood disorder   Left leg/knee: Crepitus present No swelling No deformity Neg fluid wave, joint milking ROM Flex 110 , Ext 0  TTP mildly pes anserine bursa, medial femoral condyle,   NTTP over the quad tendon,  lat fem condyle, patella, plica, patella tendon, tibial tuberostiy, fibular head, posterior fossa,  gerdy's tubercle, medial jt line, lateral jt line Neg anterior and posterior drawer Neg lachman Neg sag sign Negative varus stress Negative valgus stress Negative McMurray, though produced medial knee pain without popping Negative Thessaly   Gait normal       Electronically signed by:  [facility] 2:37 PM 11/21/23

## 2023-11-21 ENCOUNTER — Ambulatory Visit: Admitting: Sports Medicine

## 2023-11-21 VITALS — BP 132/80 | HR 78 | Ht 66.0 in | Wt 261.0 lb

## 2023-11-21 DIAGNOSIS — G8929 Other chronic pain: Secondary | ICD-10-CM

## 2023-11-21 DIAGNOSIS — M25562 Pain in left knee: Secondary | ICD-10-CM | POA: Diagnosis not present

## 2023-11-21 NOTE — Patient Instructions (Signed)
 As needed follow up.  if more than 3 month relief follow up for steroid . If less than 3 month relief follow up to discuss next steps

## 2024-01-02 ENCOUNTER — Ambulatory Visit: Admitting: Family Medicine

## 2024-01-02 ENCOUNTER — Encounter: Payer: Self-pay | Admitting: Family Medicine

## 2024-01-02 VITALS — BP 128/92 | HR 68 | Temp 98.0°F | Ht 66.0 in | Wt 257.6 lb

## 2024-01-02 DIAGNOSIS — M25562 Pain in left knee: Secondary | ICD-10-CM | POA: Diagnosis not present

## 2024-01-02 DIAGNOSIS — E782 Mixed hyperlipidemia: Secondary | ICD-10-CM | POA: Diagnosis not present

## 2024-01-02 DIAGNOSIS — G8929 Other chronic pain: Secondary | ICD-10-CM

## 2024-01-02 DIAGNOSIS — E559 Vitamin D deficiency, unspecified: Secondary | ICD-10-CM | POA: Diagnosis not present

## 2024-01-02 DIAGNOSIS — M25561 Pain in right knee: Secondary | ICD-10-CM | POA: Diagnosis not present

## 2024-01-02 DIAGNOSIS — Z2821 Immunization not carried out because of patient refusal: Secondary | ICD-10-CM | POA: Insufficient documentation

## 2024-01-02 DIAGNOSIS — H6991 Unspecified Eustachian tube disorder, right ear: Secondary | ICD-10-CM

## 2024-01-02 DIAGNOSIS — Z6841 Body Mass Index (BMI) 40.0 and over, adult: Secondary | ICD-10-CM

## 2024-01-02 DIAGNOSIS — I10 Essential (primary) hypertension: Secondary | ICD-10-CM | POA: Diagnosis not present

## 2024-01-02 DIAGNOSIS — G4733 Obstructive sleep apnea (adult) (pediatric): Secondary | ICD-10-CM

## 2024-01-02 DIAGNOSIS — K219 Gastro-esophageal reflux disease without esophagitis: Secondary | ICD-10-CM

## 2024-01-02 LAB — COMPREHENSIVE METABOLIC PANEL WITH GFR
ALT: 28 U/L (ref 0–53)
AST: 20 U/L (ref 0–37)
Albumin: 4 g/dL (ref 3.5–5.2)
Alkaline Phosphatase: 79 U/L (ref 39–117)
BUN: 11 mg/dL (ref 6–23)
CO2: 27 meq/L (ref 19–32)
Calcium: 9.6 mg/dL (ref 8.4–10.5)
Chloride: 101 meq/L (ref 96–112)
Creatinine, Ser: 0.8 mg/dL (ref 0.40–1.50)
GFR: 109.2 mL/min (ref >60.00)
Glucose, Bld: 95 mg/dL (ref 70–99)
Potassium: 3.9 meq/L (ref 3.5–5.1)
Sodium: 138 meq/L (ref 135–145)
Total Bilirubin: 0.3 mg/dL (ref 0.2–1.2)
Total Protein: 8.3 g/dL (ref 6.0–8.3)

## 2024-01-02 LAB — CBC WITH DIFFERENTIAL/PLATELET
Basophils Absolute: 0 K/uL (ref 0.0–0.1)
Basophils Relative: 0.2 % (ref 0.0–3.0)
Eosinophils Absolute: 0 K/uL (ref 0.0–0.7)
Eosinophils Relative: 0.8 % (ref 0.0–5.0)
HCT: 37.7 % — ABNORMAL LOW (ref 39.0–52.0)
Hemoglobin: 11.8 g/dL — ABNORMAL LOW (ref 13.0–17.0)
Lymphocytes Relative: 37.6 % (ref 12.0–46.0)
Lymphs Abs: 2.1 K/uL (ref 0.7–4.0)
MCHC: 31.3 g/dL (ref 30.0–36.0)
MCV: 77 fl — ABNORMAL LOW (ref 78.0–100.0)
Monocytes Absolute: 0.6 K/uL (ref 0.1–1.0)
Monocytes Relative: 10.3 % (ref 3.0–12.0)
Neutro Abs: 2.8 K/uL (ref 1.4–7.7)
Neutrophils Relative %: 51.1 % (ref 43.0–77.0)
Platelets: 309 K/uL (ref 150.0–400.0)
RBC: 4.89 Mil/uL (ref 4.22–5.81)
RDW: 17.8 % — ABNORMAL HIGH (ref 11.5–15.5)
WBC: 5.6 K/uL (ref 4.0–10.5)

## 2024-01-02 LAB — LIPID PANEL
Cholesterol: 151 mg/dL (ref 0–200)
HDL: 38.3 mg/dL — ABNORMAL LOW (ref >39.00)
LDL Cholesterol: 97 mg/dL (ref 0–99)
NonHDL: 112.94
Total CHOL/HDL Ratio: 4
Triglycerides: 80 mg/dL (ref 0.0–149.0)
VLDL: 16 mg/dL (ref 0.0–40.0)

## 2024-01-02 LAB — VITAMIN D 25 HYDROXY (VIT D DEFICIENCY, FRACTURES): VITD: 24.38 ng/mL — ABNORMAL LOW (ref 30.00–100.00)

## 2024-01-02 LAB — VITAMIN B12: Vitamin B-12: 437 pg/mL (ref 211–911)

## 2024-01-02 MED ORDER — FLUTICASONE PROPIONATE 50 MCG/ACT NA SUSP: 2.0000 | Freq: Every day | NASAL | 0 refills | Status: AC

## 2024-01-02 NOTE — Patient Instructions (Addendum)
 Welcome to [facility]!  Thank you for choosing us  for your Primary Care needs.   We offer in person and video appointments for your convenience. You may call our office to schedule appointments, or you may schedule appointments with me through MyChart.   The best way to get in contact with me is via MyChart message. This will get to me faster than a phone call, unless there is an emergency, then please call 911.  The lab is located downstairs in the Sports Medicine building, we also have xray available there.   We are checking labs today, will be in contact with any results that require further attention  Refills have been sent in to your pharmacy today  Follow-up with me in 6 mos for medication management, sooner if needed.

## 2024-01-02 NOTE — Progress Notes (Signed)
 New Patient Visit  Subjective:     Patient ID: Glenn Butler, male    DOB: 10/21/1980, 43 y.o.   MRN: [id number]  Chief Complaint  Patient presents with   Establish Care    HPI  Discussed the use of AI scribe software for clinical note transcription with the patient, who gave verbal consent to proceed.  History of Present Illness Glenn Butler is a 43 year old male who presents with ongoing knee issues and recent steroid injection.  He is a previous patient of mine from [facility].  Knee pain and discomfort - Persistent knee discomfort despite recent steroid injection - Tightness in the hamstring radiating through the knee - Wearing steel-toed boots at work may exacerbate knee discomfort - Considering use of insoles for symptom relief - Wearing Brooks shoes provides some relief  Dyslipidemia management - Recently resumed Crestor after a three-month interruption due to prescription issue - Uncertain current cholesterol levels - Increased frequency of walking - Change in weight since resuming Crestor  Sleep disturbance - Recent sleep study demonstrated poor sleep quality - Considering weight loss to improve sleep quality - Eating and sleeping adequately overall     ROS Per HPI  Outpatient Encounter Medications as of 01/02/2024  Medication Sig   AIRSUPRA 90-80 MCG/ACT AERO SMARTSIG:2 inhalation By Mouth Twice Daily   hydrochlorothiazide  (HYDRODIURIL ) 12.5 MG tablet Take 12.5 mg by mouth every morning.   meloxicam  (MOBIC ) 15 MG tablet Take 1 tablet (15 mg total) by mouth daily.   meloxicam  (MOBIC ) 15 MG tablet Take 1 tablet (15 mg total) by mouth daily.   meloxicam  (MOBIC ) 15 MG tablet Take 1 tablet (15 mg total) by mouth daily.   Multiple Vitamins-Minerals (MULTIVITAMIN MEN) TABS Take 1 tablet by mouth daily.   pantoprazole  (PROTONIX ) 40 MG tablet Take 1 tablet (40 mg total) by mouth daily.   rosuvastatin (CRESTOR) 5 MG tablet Take 5 mg by mouth daily.    [DISCONTINUED] fluticasone  (FLONASE ) 50 MCG/ACT nasal spray SPRAY 2 SPRAYS INTO EACH NOSTRIL EVERY DAY   fluticasone  (FLONASE ) 50 MCG/ACT nasal spray Place 2 sprays into both nostrils daily.   No facility-administered encounter medications on file as of 01/02/2024.    Past Medical History:  Diagnosis Date   Allergy    Environmental allergies    Seasonal allergies     Past Surgical History:  Procedure Laterality Date   Unremarkable.      Family History  Problem Relation Age of Onset   Hypertension Father        Living   Cervical cancer Mother        Living   Hypertension Paternal Grandmother    Hypertension Paternal Grandfather    Stroke Paternal Grandfather    Healthy Brother        x3   Healthy Sister        x4   Healthy Daughter        x1   Diabetes Maternal Grandmother     Social History   Socioeconomic History   Marital status: Married    Spouse name: Not on file   Number of children: Not on file   Years of education: Not on file   Highest education level: Not on file  Occupational History   Not on file  Tobacco Use   Smoking status: Never   Smokeless tobacco: Never  Vaping Use   Vaping status: Never Used  Substance and Sexual Activity   Alcohol use: No  Alcohol/week: 0.0 standard drinks of alcohol   Drug use: No   Sexual activity: Yes  Other Topics Concern   Not on file  Social History Narrative   Not on file   Social Drivers of Health   Financial Resource Strain: Not on file  Food Insecurity: Not on file  Transportation Needs: Not on file  Physical Activity: Not on file  Stress: Not on file  Social Connections: Not on file  Intimate Partner Violence: Not on file       Objective:    BP (!) 128/92 Comment: Repeat  Pulse 68   Temp 98 F (36.7 C) (Temporal)   Ht 5' 6 (1.676 m)   Wt 257 lb 9.6 oz (116.8 kg)   SpO2 99%   BMI 41.58 kg/m    Physical Exam Vitals and nursing note reviewed.  Constitutional:      General: He is  not in acute distress.    Appearance: Normal appearance. He is obese.  HENT:     Head: Normocephalic and atraumatic.     Right Ear: External ear normal.     Left Ear: External ear normal.     Nose: Nose normal.     Mouth/Throat:     Mouth: Mucous membranes are moist.     Pharynx: Oropharynx is clear.  Eyes:     Extraocular Movements: Extraocular movements intact.  Cardiovascular:     Rate and Rhythm: Normal rate and regular rhythm.     Pulses: Normal pulses.     Heart sounds: Normal heart sounds.  Pulmonary:     Effort: Pulmonary effort is normal. No respiratory distress.     Breath sounds: Normal breath sounds. No wheezing, rhonchi or rales.  Musculoskeletal:        General: Normal range of motion.     Cervical back: Normal range of motion.     Right lower leg: No edema.     Left lower leg: No edema.  Lymphadenopathy:     Cervical: No cervical adenopathy.  Skin:    General: Skin is warm and dry.  Neurological:     General: No focal deficit present.     Mental Status: He is alert and oriented to person, place, and time.  Psychiatric:        Mood and Affect: Mood normal.        Behavior: Behavior normal.     No results found for any visits on 01/02/24.      Assessment & Plan:   Assessment and Plan Assessment & Plan Chronic Knee Pain Chronic right knee pain with associated hamstring tightness. Recent steroid injection provided temporary relief. Overcompensation may contribute to hamstring tightness. Steel-toed boots may exacerbate knee discomfort due to altered gait. - Recommend different insoles for steel-toed boots to alleviate knee discomfort. - F/u with sports med as scheduled  Obstructive sleep apnea  Records release signed for sleep med records Awaiting CPAP Suspected obstructive sleep apnea based on unfavorable sleep study results. He is considering weight loss to improve symptoms. Sleep study conducted at [facility]; results needed for further evaluation. -  Obtain records release for sleep study results from [facility].  Mixed Hyperlipidemia Crestor was discontinued for three months due to prescription issues but has been restarted. No significant dietary changes, but increased physical activity noted. Awaiting lab results to assess current lipid levels. - Order labs to assess lipid levels. - Continue Crestor as previously prescribed.  Primary Hypertension - Controlled today - CBC, CMP today - Continue  hydrochlorothiazide   GERD - stable  Morbid Obesity - has lost 30 pounds recently - Continue efforts in healthy diet and activity level, weight loss  Vitamin D  deficiency - Will check vitamin D  levels today and treat as indicated  Right eustachian tube dysfunction -Stable -Continue Flonase  as needed  Declines flu vaccine today     Orders Placed This Encounter  Procedures   CBC with Differential/Platelet    Release to patient:   Immediate [1]   Comprehensive metabolic panel with GFR    Release to patient:   Immediate [1]   Lipid panel   Vitamin B12   VITAMIN D  25 Hydroxy (Vit-D Deficiency, Fractures)     Meds ordered this encounter  Medications   fluticasone  (FLONASE ) 50 MCG/ACT nasal spray    Sig: Place 2 sprays into both nostrils daily.    Dispense:  16 mL    Refill:  0    Return in about 6 months (around 07/01/2024) for meds, f/u.  Corean LITTIE Ku, FNP

## 2024-01-04 ENCOUNTER — Ambulatory Visit: Payer: Self-pay | Admitting: Family Medicine

## 2024-01-04 DIAGNOSIS — E559 Vitamin D deficiency, unspecified: Secondary | ICD-10-CM

## 2024-01-04 MED ORDER — VITAMIN D (ERGOCALCIFEROL) 1.25 MG (50000 UNIT) PO CAPS: 50000.0000 [IU] | ORAL_CAPSULE | ORAL | 0 refills | Status: AC
